# Patient Record
Sex: Female | Born: 1970 | Race: White | Hispanic: No | Marital: Married | Smoking: Never smoker
Health system: Southern US, Community
[De-identification: ages and names within clinical notes are randomized; demographics above are authoritative.]

## PROBLEM LIST (undated history)

## (undated) DIAGNOSIS — Z8619 Personal history of other infectious and parasitic diseases: Secondary | ICD-10-CM

## (undated) DIAGNOSIS — I341 Nonrheumatic mitral (valve) prolapse: Secondary | ICD-10-CM

## (undated) DIAGNOSIS — C4491 Basal cell carcinoma of skin, unspecified: Secondary | ICD-10-CM

## (undated) DIAGNOSIS — Z87440 Personal history of urinary (tract) infections: Secondary | ICD-10-CM

## (undated) DIAGNOSIS — T7840XA Allergy, unspecified, initial encounter: Secondary | ICD-10-CM

## (undated) HISTORY — DX: Basal cell carcinoma of skin, unspecified: C44.91

## (undated) HISTORY — DX: Nonrheumatic mitral (valve) prolapse: I34.1

## (undated) HISTORY — DX: Personal history of other infectious and parasitic diseases: Z86.19

## (undated) HISTORY — DX: Allergy, unspecified, initial encounter: T78.40XA

## (undated) HISTORY — DX: Personal history of urinary (tract) infections: Z87.440

---

## 1993-11-22 HISTORY — PX: BREAST SURGERY: SHX581

## 1999-07-22 ENCOUNTER — Other Ambulatory Visit: Admission: RE | Admit: 1999-07-22 | Discharge: 1999-07-22 | Payer: Self-pay | Admitting: Obstetrics and Gynecology

## 2000-08-01 ENCOUNTER — Other Ambulatory Visit: Admission: RE | Admit: 2000-08-01 | Discharge: 2000-08-01 | Payer: Self-pay | Admitting: Gynecology

## 2001-05-09 ENCOUNTER — Other Ambulatory Visit: Admission: RE | Admit: 2001-05-09 | Discharge: 2001-05-09 | Payer: Self-pay | Admitting: *Deleted

## 2002-06-11 ENCOUNTER — Other Ambulatory Visit: Admission: RE | Admit: 2002-06-11 | Discharge: 2002-06-11 | Payer: Self-pay | Admitting: Obstetrics and Gynecology

## 2003-05-21 ENCOUNTER — Other Ambulatory Visit: Admission: RE | Admit: 2003-05-21 | Discharge: 2003-05-21 | Payer: Self-pay | Admitting: *Deleted

## 2004-01-30 ENCOUNTER — Other Ambulatory Visit: Admission: RE | Admit: 2004-01-30 | Discharge: 2004-01-30 | Payer: Self-pay | Admitting: Obstetrics and Gynecology

## 2004-08-27 ENCOUNTER — Inpatient Hospital Stay (HOSPITAL_COMMUNITY): Admission: AD | Admit: 2004-08-27 | Discharge: 2004-08-29 | Payer: Self-pay | Admitting: Obstetrics and Gynecology

## 2004-10-01 ENCOUNTER — Encounter: Admission: RE | Admit: 2004-10-01 | Discharge: 2004-10-31 | Payer: Self-pay | Admitting: Obstetrics and Gynecology

## 2005-09-07 ENCOUNTER — Ambulatory Visit: Payer: Self-pay | Admitting: Family Medicine

## 2006-05-22 ENCOUNTER — Inpatient Hospital Stay (HOSPITAL_COMMUNITY): Admission: AD | Admit: 2006-05-22 | Discharge: 2006-05-22 | Payer: Self-pay | Admitting: Obstetrics and Gynecology

## 2006-07-20 ENCOUNTER — Inpatient Hospital Stay (HOSPITAL_COMMUNITY): Admission: RE | Admit: 2006-07-20 | Discharge: 2006-07-22 | Payer: Self-pay | Admitting: Obstetrics and Gynecology

## 2008-11-22 HISTORY — PX: BREAST ENHANCEMENT SURGERY: SHX7

## 2009-04-28 ENCOUNTER — Ambulatory Visit: Payer: Self-pay | Admitting: Internal Medicine

## 2009-04-28 LAB — CONVERTED CEMR LAB
Bilirubin Urine: NEGATIVE
Glucose, Urine, Semiquant: NEGATIVE
Ketones, urine, test strip: NEGATIVE
Protein, U semiquant: NEGATIVE
pH: 5

## 2009-05-01 ENCOUNTER — Telehealth (INDEPENDENT_AMBULATORY_CARE_PROVIDER_SITE_OTHER): Payer: Self-pay | Admitting: *Deleted

## 2009-09-26 ENCOUNTER — Ambulatory Visit: Payer: Self-pay | Admitting: Internal Medicine

## 2009-09-26 DIAGNOSIS — Z85828 Personal history of other malignant neoplasm of skin: Secondary | ICD-10-CM | POA: Insufficient documentation

## 2009-09-26 DIAGNOSIS — I7300 Raynaud's syndrome without gangrene: Secondary | ICD-10-CM | POA: Insufficient documentation

## 2009-09-29 ENCOUNTER — Encounter (INDEPENDENT_AMBULATORY_CARE_PROVIDER_SITE_OTHER): Payer: Self-pay | Admitting: *Deleted

## 2010-03-11 ENCOUNTER — Ambulatory Visit: Payer: Self-pay | Admitting: Internal Medicine

## 2010-03-11 DIAGNOSIS — M545 Low back pain, unspecified: Secondary | ICD-10-CM | POA: Insufficient documentation

## 2010-03-11 DIAGNOSIS — IMO0002 Reserved for concepts with insufficient information to code with codable children: Secondary | ICD-10-CM | POA: Insufficient documentation

## 2010-03-11 DIAGNOSIS — R509 Fever, unspecified: Secondary | ICD-10-CM | POA: Insufficient documentation

## 2010-03-11 LAB — CONVERTED CEMR LAB
Bilirubin Urine: NEGATIVE
Blood in Urine, dipstick: NEGATIVE
Inflenza A Ag: NEGATIVE
Influenza B Ag: NEGATIVE
Ketones, urine, test strip: NEGATIVE
Nitrite: NEGATIVE
Specific Gravity, Urine: 1.015

## 2010-03-16 ENCOUNTER — Telehealth (INDEPENDENT_AMBULATORY_CARE_PROVIDER_SITE_OTHER): Payer: Self-pay | Admitting: *Deleted

## 2010-03-16 LAB — CONVERTED CEMR LAB
AST: 22 units/L (ref 0–37)
Albumin: 4.5 g/dL (ref 3.5–5.2)
Alkaline Phosphatase: 53 units/L (ref 39–117)
Basophils Relative: 0.9 % (ref 0.0–3.0)
Bilirubin, Direct: 0 mg/dL (ref 0.0–0.3)
CO2: 29 meq/L (ref 19–32)
Calcium: 9.5 mg/dL (ref 8.4–10.5)
Creatinine, Ser: 0.7 mg/dL (ref 0.4–1.2)
Eosinophils Absolute: 0 10*3/uL (ref 0.0–0.7)
Eosinophils Relative: 1 % (ref 0.0–5.0)
GFR calc non Af Amer: 100.3 mL/min (ref >60)
Hemoglobin: 12.8 g/dL (ref 12.0–15.0)
Lymphocytes Relative: 37.5 % (ref 12.0–46.0)
Monocytes Relative: 6.4 % (ref 3.0–12.0)
Neutro Abs: 2 10*3/uL (ref 1.4–7.7)
Neutrophils Relative %: 54.2 % (ref 43.0–77.0)
RBC: 4.47 M/uL (ref 3.87–5.11)
Sodium: 139 meq/L (ref 135–145)
Total Protein: 7.3 g/dL (ref 6.0–8.3)
WBC: 3.8 10*3/uL — ABNORMAL LOW (ref 4.5–10.5)

## 2010-03-17 ENCOUNTER — Ambulatory Visit: Payer: Self-pay | Admitting: Internal Medicine

## 2010-03-17 DIAGNOSIS — M2550 Pain in unspecified joint: Secondary | ICD-10-CM | POA: Insufficient documentation

## 2010-03-18 ENCOUNTER — Encounter: Payer: Self-pay | Admitting: Internal Medicine

## 2010-03-18 ENCOUNTER — Telehealth (INDEPENDENT_AMBULATORY_CARE_PROVIDER_SITE_OTHER): Payer: Self-pay | Admitting: *Deleted

## 2010-03-19 ENCOUNTER — Telehealth (INDEPENDENT_AMBULATORY_CARE_PROVIDER_SITE_OTHER): Payer: Self-pay | Admitting: *Deleted

## 2010-03-19 LAB — CONVERTED CEMR LAB
Basophils Relative: 2.2 % (ref 0.0–3.0)
Eosinophils Absolute: 0.1 10*3/uL (ref 0.0–0.7)
Eosinophils Relative: 1.5 % (ref 0.0–5.0)
HCT: 40.2 % (ref 36.0–46.0)
Lymphs Abs: 2.3 10*3/uL (ref 0.7–4.0)
MCHC: 33.8 g/dL (ref 30.0–36.0)
MCV: 85.1 fL (ref 78.0–100.0)
Monocytes Absolute: 0.5 10*3/uL (ref 0.1–1.0)
Platelets: 266 10*3/uL (ref 150.0–400.0)
Rhuematoid fact SerPl-aCnc: 20 intl units/mL (ref 0.0–20.0)
WBC: 3.9 10*3/uL — ABNORMAL LOW (ref 4.5–10.5)

## 2010-03-20 ENCOUNTER — Telehealth (INDEPENDENT_AMBULATORY_CARE_PROVIDER_SITE_OTHER): Payer: Self-pay

## 2010-03-20 ENCOUNTER — Telehealth: Payer: Self-pay | Admitting: Family Medicine

## 2010-03-23 ENCOUNTER — Encounter (INDEPENDENT_AMBULATORY_CARE_PROVIDER_SITE_OTHER): Payer: Self-pay | Admitting: *Deleted

## 2010-04-09 ENCOUNTER — Ambulatory Visit: Payer: Self-pay | Admitting: Internal Medicine

## 2010-04-09 DIAGNOSIS — R21 Rash and other nonspecific skin eruption: Secondary | ICD-10-CM | POA: Insufficient documentation

## 2010-04-09 DIAGNOSIS — D7289 Other specified disorders of white blood cells: Secondary | ICD-10-CM | POA: Insufficient documentation

## 2010-04-10 ENCOUNTER — Ambulatory Visit: Payer: Self-pay | Admitting: Internal Medicine

## 2010-04-13 LAB — CONVERTED CEMR LAB
Basophils Absolute: 0.1 10*3/uL (ref 0.0–0.1)
Eosinophils Absolute: 0.1 10*3/uL (ref 0.0–0.7)
HCT: 39.7 % (ref 36.0–46.0)
Hemoglobin: 13.4 g/dL (ref 12.0–15.0)
Lymphs Abs: 1.6 10*3/uL (ref 0.7–4.0)
MCHC: 33.7 g/dL (ref 30.0–36.0)
Neutro Abs: 6.1 10*3/uL (ref 1.4–7.7)
Platelets: 234 10*3/uL (ref 150.0–400.0)
RDW: 13.5 % (ref 11.5–14.6)

## 2010-05-22 ENCOUNTER — Telehealth (INDEPENDENT_AMBULATORY_CARE_PROVIDER_SITE_OTHER): Payer: Self-pay | Admitting: *Deleted

## 2010-06-05 ENCOUNTER — Encounter: Payer: Self-pay | Admitting: Internal Medicine

## 2010-06-16 ENCOUNTER — Ambulatory Visit: Payer: Self-pay | Admitting: Internal Medicine

## 2010-06-16 DIAGNOSIS — R198 Other specified symptoms and signs involving the digestive system and abdomen: Secondary | ICD-10-CM | POA: Insufficient documentation

## 2010-12-20 LAB — CONVERTED CEMR LAB
AST: 21 units/L (ref 0–37)
Albumin: 4.4 g/dL (ref 3.5–5.2)
Alkaline Phosphatase: 55 units/L (ref 39–117)
BUN: 8 mg/dL (ref 6–23)
Basophils Absolute: 0 10*3/uL (ref 0.0–0.1)
CO2: 29 meq/L (ref 19–32)
Calcium: 9.3 mg/dL (ref 8.4–10.5)
Cholesterol: 189 mg/dL (ref 0–200)
Eosinophils Absolute: 0.1 10*3/uL (ref 0.0–0.7)
GFR calc non Af Amer: 86.2 mL/min (ref >60)
Glucose, Bld: 75 mg/dL (ref 70–99)
HDL: 62.8 mg/dL (ref >39.00)
Lymphocytes Relative: 17.3 % (ref 12.0–46.0)
Lymphs Abs: 1.6 10*3/uL (ref 0.7–4.0)
MCHC: 33.7 g/dL (ref 30.0–36.0)
Monocytes Relative: 4.7 % (ref 3.0–12.0)
Platelets: 264 10*3/uL (ref 150.0–400.0)
RDW: 12.1 % (ref 11.5–14.6)
TSH: 1.3 microintl units/mL (ref 0.35–5.50)
Total Bilirubin: 0.9 mg/dL (ref 0.3–1.2)
Triglycerides: 58 mg/dL (ref 0.0–149.0)
VLDL: 11.6 mg/dL (ref 0.0–40.0)

## 2010-12-22 NOTE — Progress Notes (Signed)
Summary: Lab results  Phone Note Outgoing Call Call back at Home Phone [phone]   Call placed by: Shonna Chock,  March 19, 2010 10:42 AM Call placed to: Patient Summary of Call: Spoke with patient:  Low white count & increased lymphocyte count suggest recent viral (non bacterial) process. Sed rate measures inflammation; it is normal, as is the Rheumatoid Arthritis screening test.Consider the supplement Echinacea(OTC)  & 2000 mg vitamin C daily  for 7-10 days to enhance your immune system. Hopp  Patient ok'd all information and is aware copy to be mailed./[facility]  March 19, 2010 10:42 AM

## 2010-12-22 NOTE — Progress Notes (Signed)
Summary: reaction to med ?  Phone Note Call from Patient   Caller: Patient Summary of Call: patient is here for labs -non fasting -patient was seen [id number] - she took 1st dose of citalopram last night - she is dizzy - pupisl dialated - hands shaky - anxiety --- please advise Initial call taken by: Okey Regal [place],  March 18, 2010 11:27 AM  Follow-up for Phone Call        per dr hopper decrease to 1/2 pill daily and add lorazepam 0.5mg  1 tab every 6 to 8 hours as needed #30.Marland KitchenMarland KitchenFelecia Deloach CMA  March 18, 2010 12:14 PM   pt aware rx sent to pharmacy...............Marland KitchenFelecia Deloach CMA  March 18, 2010 12:16 PM     New/Updated Medications: LORAZEPAM 0.5 MG TABS (LORAZEPAM) Take 1 tab every 6 to 8 hours as needed Prescriptions: LORAZEPAM 0.5 MG TABS (LORAZEPAM) Take 1 tab every 6 to 8 hours as needed  #30 x 0   Entered by:   Jeremy Johann CMA   Authorized by:   Marga Melnick MD   Signed by:   Jeremy Johann CMA on 03/18/2010   Method used:   Printed then faxed to ...       [facility] # [address] (retail)       [address]       [place], [place]  [address]       Ph: [phone]       Fax: [phone]   RxID:   [id number]

## 2010-12-22 NOTE — Progress Notes (Signed)
Summary: lab results  Phone Note Call from Patient   Caller: Patient Summary of Call: pt called wanted result of labs. pt also states that symptoms have not subsided and actually have progress some. advise pt per dr hopp refer to neurology, pt ok awaiting appt info. pt also advise to give [place] a call if she needs anything prior to appt with neurology, pt ok..............Marland KitchenFelecia Deloach CMA  March 16, 2010 4:31 PM

## 2010-12-22 NOTE — Progress Notes (Signed)
Summary: SE to meds   Phone Note Outgoing Call   Call placed by: Army Fossa CMA,  March 20, 2010 3:54 PM Summary of Call: I spoke with pt about her lab results, she states that her fever is gone. But she is now having possible reactions to her Citalopram she thinks. She is not sleeping well. She is having numbness and tingling in her arms and legs. She has noticied some Tremors. Advisied pt to stop taking medication, any further advice for pt. Army Fossa CMA  March 20, 2010 3:55 PM   Follow-up for Phone Call        if pt's sxs are worse or different than when she was here on 4/20 probably needs to be evaluated. Follow-up by: Neena Rhymes MD,  March 20, 2010 4:10 PM  Additional Follow-up for Phone Call Additional follow up Details #1::        I spoke with pt and pt's husband and they both say that her symptoms are different. I informed pt that she should be evalutated. Pts husband states to me that she has had worse days than today. He is requesting sat clinic. Scheduled pt also told pts husband if symptoms get worse to please take her to ER. pts husband agreed. Army Fossa CMA  March 20, 2010 4:30 PM

## 2010-12-22 NOTE — Progress Notes (Signed)
Summary: FYI  Phone Note Call from Patient Call back at Home Phone [phone]   Caller: Patient Reason for Call: Talk to Doctor Summary of Call: Patient just called to let us know that she saw her GYN this past month for an infection. He put her on an abx and she said it has made her feel so much better. She said most of the symptoms she had been seeing Dr. Alwyn Ren for recently have supsided. She just wanted to let us know.  Initial call taken by: Harold Barban,  May 22, 2010 1:52 PM

## 2010-12-22 NOTE — Progress Notes (Signed)
Summary: phone  Phone Note Outgoing Call   Call placed by: Raechel Ache, RN,  March 20, 2010 4:47 PM Call placed to: Patient Summary of Call: advised Sat clinic not appropriate for her problems- acute only and to f/u with Dr Alwyn Ren next week per Dr Clent Ridges. Initial call taken by: Raechel Ache, RN,  March 20, 2010 4:48 PM

## 2010-12-22 NOTE — Assessment & Plan Note (Signed)
Summary: symptoms are progressing//lch   Vital Signs:  Patient profile:   40 year old female Weight:      123 pounds Temp:     98.8 degrees F oral Pulse rate:   78 / minute Resp:     15 per minute BP sitting:   118 / 78  (left arm)  Vitals Entered By: Jeremy Johann CMA (March 17, 2010 2:40 PM) CC: increasing burning, cool sensations on several parts of the body, Lower Extremity Joint pain Comments REVIEWED MED LIST, PATIENT AGREED DOSE AND INSTRUCTION CORRECT    CC:  increasing burning, cool sensations on several parts of the body, and Lower Extremity Joint pain.  History of Present Illness: She has had migratory joint pains as of 03/13/2010 involving hands, wrists , shoulders , hips, knees & feet intermittently. The patient reports redness, popping, stiffness for >1 hr, and weakness, but denies swelling, locking, and decreased ROM.  The pain is described as burning-aching.  The patient denies the following symptoms: eye symptoms. The back pain has resolved.Terrible dreams about not being able to breathe;early  awakening. No constellation of headaches, chest pain, flushing & diarrhea. I have been overwhelmed since motherhood. Her mother-in-law has MS; that is a concern for her.  Allergies: 1)  ! Cipro 2)  ! Hydrocodone  Review of Systems General:  Complains of fever and sweats; denies chills; Temp 99 or <. Eyes:  Denies discharge, red eye, and vision loss-both eyes. Resp:  Denies cough and sputum productive. GI:  Denies constipation and diarrhea. GU:  Denies discharge, dysuria, and hematuria. Derm:  Complains of changes in color of skin and rash; denies changes in nail beds, dryness, hair loss, insect bite(s), and lesion(s); Minor splotchy rash over abdomen. No ticks isolated. Neuro:  Denies numbness; Intermittent tingling in hands & feet. Psych:  Complains of anxiety and easily tearful; denies depression, easily angered, irritability, and panic attacks. Heme:  Denies abnormal  bruising and bleeding.  Physical Exam  General:  Thin but well-nourished,in no acute distress; alert,appropriate and cooperative throughout examination Eyes:  No corneal or conjunctival inflammation noted.  Perrla.No lid lag  Neck:  No deformities, masses, or tenderness noted.Neck supple Lungs:  Normal respiratory effort, chest expands symmetrically. Lungs are clear to auscultation, no crackles or wheezes. Heart:  Normal rate and regular rhythm. S1 and S2 normal without gallop, murmur, click, rub. S4 Abdomen:  Bowel sounds positive,abdomen soft and non-tender without masses, organomegaly or hernias noted. Extremities:  No clubbing, cyanosis, edema, or deformity noted with normal full range of motion of all joints.   Neurologic:  alert & oriented X3, strength normal in all extremities, and DTRs symmetrical and normal.   Skin:  Intact without suspicious lesions or rashes Cervical Nodes:  No lymphadenopathy noted Axillary Nodes:  No palpable lymphadenopathy Psych:  tearful when discussing her fears  and slightly anxious.     Impression & Recommendations:  Problem # 1:  ARTHRALGIA (ICD-719.40)  Orders: Venipuncture ([address]) T-Lyme Disease (60454-09811) T- * Misc. Laboratory test [phone])  Problem # 2:  FEVER UNSPECIFIED (ICD-780.60)  Orders: Venipuncture (29562) TLB-CBC Platelet - w/Differential (85025-CBCD) TLB-Rheumatoid Factor (RA) (13086-VH) TLB-Sedimentation Rate (ESR) (85652-ESR) T-Lyme Disease (84696-29528) T- * Misc. Laboratory test [phone])  Complete Medication List: 1)  Ibuprofen 200 Mg Tabs (Ibuprofen) .... As needed 2)  Doxycycline Hyclate 100 Mg Caps (Doxycycline hyclate) .Marland Kitchen.. 1 two times a day ; avoid sun 3)  Citalopram Hydrobromide 20 Mg Tabs (Citalopram hydrobromide) .Marland Kitchen.. 1 once daily  Patient  Instructions: 1)  Please keep Temp Diary. Prescriptions: CITALOPRAM HYDROBROMIDE 20 MG TABS (CITALOPRAM HYDROBROMIDE) 1 once daily  #30 x 5   Entered and Authorized by:    Marga Melnick MD   Signed by:   Marga Melnick MD on 03/17/2010   Method used:   Print then Give to Patient   RxID:   [id number] DOXYCYCLINE HYCLATE 100 MG CAPS (DOXYCYCLINE HYCLATE) 1 two times a day ; avoid sun  #20 x 0   Entered and Authorized by:   Marga Melnick MD   Signed by:   Marga Melnick MD on 03/17/2010   Method used:   Print then Give to Patient   RxID:   [id number]

## 2010-12-22 NOTE — Assessment & Plan Note (Signed)
Summary: backpain/kdc   Vital Signs:  Patient profile:   40 year old female Weight:      125.4 pounds Temp:     99.3 degrees F oral Pulse rate:   84 / minute BP sitting:   94 / 68  (left arm) Cuff size:   regular  Vitals Entered By: Shonna Chock (March 11, 2010 12:27 PM) CC: Onset Friday: Lower back pain, low grade fever, buring sensations on several parts of the body, Back pain Comments REVIEWED MED LIST, PATIENT AGREED DOSE AND INSTRUCTION CORRECT    CC:  Onset Friday: Lower back pain, low grade fever, buring sensations on several parts of the body, and Back pain.  History of Present Illness:  Back Pain      This is a 40 year old woman who presents with Back pain .  The patient reports fever&  chills, slight leg weakness, rest pain, and inability to do housework, but denies loss of sensation, fecal incontinence, urinary incontinence, urinary retention, and dysuria.  The pain is located in the mid low back.  The pain began at home  gradually on Sat 03/07/2010.  The pain radiates to the right leg below the knee & into foot as an "icy hot " sensation. Same sensation in both legs 04/16.Also same sensation intermittently  interscapular areas,posterior upper arms .  The pain is made worse by flexion and inactivity( sitting).  The pain is made better by activity(ambulation) and minimally with NSAID medications.  On 04/10 she had "sensation that shoes too tight & calves tingling " for 1-2 hrs  after sitting in church. Now all symptoms are improving.PMH of "pop"  with   epidural administration   with  residual "coldness "  during  second delivery.  Allergies: 1)  ! Cipro 2)  ! Hydrocodone  Past History:  Past Medical History: MVP  based on clinical impression (no ECHO done) ; IUD Skin cancer, PMH of, Basal Cell 07/2009, Dr Karlyn Agee  Past Surgical History: G2 P2; breast augmentation 3/10; Wisdom teeth extration; Nasal cautery in high school  for epistaxis  Review of Systems CV:  Denies  chest pain or discomfort and palpitations. GI:  Denies diarrhea. GU:  Denies discharge and hematuria. MS:  Denies joint pain, joint redness, and joint swelling. Derm:  Denies changes in color of skin, lesion(s), and rash. Neuro:  Denies brief paralysis, disturbances in coordination, headaches, inability to speak, poor balance, and tremors. Heme:  Denies abnormal bruising and bleeding.  Physical Exam  General:  Thin but  well-nourished,in no acute distress; alert,appropriate and cooperative throughout examination Eyes:  No corneal or conjunctival inflammation noted. EOMI. Perrla. Field of  Vision grossly normal. Mouth:  Oral mucosa and oropharynx without lesions or exudates.  Teeth in good repair.No tongue deviation Neck:  No deformities, masses, or tenderness noted.Supple Lungs:  Normal respiratory effort, chest expands symmetrically. Lungs are clear to auscultation, no crackles or wheezes. Heart:  Normal rate and regular rhythm. S1 and S2 normal without gallop, murmur,  rub .Click @ apex  Abdomen:  Bowel sounds positive,abdomen soft and non-tender without masses, organomegaly or hernias noted. Pulses:  R and L carotid,radial,dorsalis pedis and posterior tibial pulses are full and equal bilaterally. No carotid bruits Extremities:  No clubbing, cyanosis, edema, or deformity noted with normal full range of motion of all joints.  Negative SLR bilaterally Neurologic:  alert & oriented X3, cranial nerves II-XII intact, strength normal in all extremities, sensation intact to light touch, heel/ toe  gait normal, DTRs symmetrical and normal, finger-to-nose normal, heel-to-shin normal, toes down bilaterally on Babinski, and Romberg negative.  No pronation drift.RAM & rapid speech normal Skin:  Intact without suspicious lesions or rashes Cervical Nodes:  No lymphadenopathy noted Axillary Nodes:  No palpable lymphadenopathy Psych:  memory intact for recent and remote, normally interactive, and good eye  contact.     Impression & Recommendations:  Problem # 1:  LOW BACK PAIN, ACUTE (ICD-724.2)  Her updated medication list for this problem includes:    Ibuprofen 200 Mg Tabs (Ibuprofen) .Marland Kitchen... As needed  Orders: Venipuncture (09811) TLB-Sedimentation Rate (ESR) (85652-ESR)  Problem # 2:  UNSPECIFIED NEURALGIA NEURITIS AND RADICULITIS (ICD-729.2)  in both legs & arms & interscapular area  Orders: Venipuncture (91478) TLB-Sedimentation Rate (ESR) (85652-ESR)  Problem # 3:  FEVER UNSPECIFIED (ICD-780.60)  Orders: Flu A+B (29562) Venipuncture (13086) TLB-BMP (Basic Metabolic Panel-BMET) (80048-METABOL) TLB-CBC Platelet - w/Differential (85025-CBCD) TLB-Hepatic/Liver Function Pnl (80076-HEPATIC) TLB-TSH (Thyroid Stimulating Hormone) (84443-TSH)  Complete Medication List: 1)  Ibuprofen 200 Mg Tabs (Ibuprofen) .... As needed  Other Orders: UA Dipstick w/o Micro (manual) (57846)  Patient Instructions: 1)  Keep  Symptom & Temp Diary. Neurology consult if symptoms persist or progress  Laboratory Results   Urine Tests    Routine Urinalysis   Color: yellow Appearance: Clear Glucose: negative   (Normal Range: Negative) Bilirubin: negative   (Normal Range: Negative) Ketone: negative   (Normal Range: Negative) Spec. Gravity: 1.015   (Normal Range: 1.003-1.035) Blood: negative   (Normal Range: Negative) pH: 6.0   (Normal Range: 5.0-8.0) Protein: negative   (Normal Range: Negative) Urobilinogen: 0.2   (Normal Range: 0-1) Nitrite: negative   (Normal Range: Negative) Leukocyte Esterace: negative   (Normal Range: Negative)     Date/Time Reported: March 11, 2010 1:16 PM  Other Tests  Influenza A: negative Influenza B: negative

## 2010-12-22 NOTE — Letter (Signed)
Summary: Primary Care Consult Scheduled Letter  [facility]  [address] [place], [place] [address]   Phone: [phone]  Fax: [phone]      03/23/2010 MRN: [id number]  [facility] [address] [place], [place]  [address]    Dear Alexa Gonzalez,    We have scheduled an appointment for you.  At the recommendation of Dr. Marga Melnick, we have scheduled you a consult with Dr. Avie Echevaria of [facility] on 04-30-2010 at 9:30am.  Their address is [address], [address], [place] [address]. The office phone number is [phone].  If this appointment day and time is not convenient for you, please feel free to call the office of the doctor you are being referred to at the number listed above and reschedule the appointment.    It is important for you to keep your scheduled appointments. We are here to make sure you are given good patient care.   Thank you,    Renee, Patient Care Coordinator [facility]

## 2010-12-22 NOTE — Assessment & Plan Note (Signed)
Summary: talk about symptoms//lch   Vital Signs:  Patient profile:   40 year old female Weight:      122.0 pounds Temp:     99.1 degrees F oral Pulse rate:   64 / minute Resp:     15 per minute BP sitting:   117 / 80  (left arm) Cuff size:   regular  Vitals Entered By: Shonna Chock (Apr 09, 2010 3:37 PM) CC: Discuss Crazy Symptoms x 5 weeks Comments REVIEWED MED LIST, PATIENT AGREED DOSE AND INSTRUCTION CORRECT    CC:  Discuss Crazy Symptoms x 5 weeks.  History of Present Illness: Paresthesias , burning & electric sensation have  no pattern & involves all anatomic area including limbs, face , thorax & pubic area. Low grad fevers especially in afternoon. Slight tremor L ulnar distribution & light rash over abdomen in am & @ night . Arthralgias diffusely, L > R joints. Insomnia & excess daytime somnulence. Dr Sandria Manly diagnosed possible viral process & recommended monitoring. Lab copies reviewed. She completed Doxycycline over 14 days. Even 1/2 Citalopram 20 mg was not tolerated; it caused insomnia. Lorazepam was not tolerated;it "wired me"  Allergies: 1)  ! Cipro 2)  ! Hydrocodone  Physical Exam  General:  Thin,in no acute distress; alert,appropriate and cooperative throughout examination Eyes:  No corneal or conjunctival inflammation noted. No lid lag. Perrla. Neck:  No deformities, masses, or tenderness noted. Thyroid full Lungs:  Normal respiratory effort, chest expands symmetrically. Lungs are clear to auscultation, no crackles or wheezes. Heart:  Normal rate and regular rhythm. S1 and S2 normal without gallop, murmur,  rub. Click suggested @ apex Abdomen:  Bowel sounds positive,abdomen soft and non-tender without masses, organomegaly or hernias noted. Neurologic:  alert & oriented X3 and DTRs symmetrical and normal.  No tremor Skin:  Faint erythema  over abdomen Cervical Nodes:  No lymphadenopathy noted Axillary Nodes:  No palpable lymphadenopathy Psych:  memory intact for  recent and remote, normally interactive, and slightly anxious & apprppriately frustrated..     Impression & Recommendations:  Problem # 1:  FEVER UNSPECIFIED (ICD-780.60)  Problem # 2:  ARTHRALGIA (ICD-719.40)  Problem # 3:  UNSPECIFIED NEURALGIA NEURITIS AND RADICULITIS (ICD-729.2)  Problem # 4:  LYMPHOCYTOSIS (ICD-288.8)  Problem # 5:  RASH-NONVESICULAR (ICD-782.1)  Complete Medication List: 1)  Vitamin C 1000 Mg Tabs (Ascorbic acid) .Marland Kitchen.. 1 by mouth once daily 2)  Zolpidem Tartrate 5 Mg Tabs (Zolpidem tartrate) .Marland Kitchen.. 1 at bedtime every 3rd night as needed 3)  Cymbalta 60 Mg Cpep (Duloxetine hcl) .Marland Kitchen.. 1 once daily  Patient Instructions: 1)  Schedule Lans: Lyme & RMSF  Titers, CBC &dif. Keep  Temp Diary. assess response to low dose Cymbalta 30 mg once daily . Prescriptions: CYMBALTA 60 MG CPEP (DULOXETINE HCL) 1 once daily  #30 x 1   Entered and Authorized by:   Marga Melnick MD   Signed by:   Marga Melnick MD on 04/09/2010   Method used:   Print then Give to Patient   RxID:   [id number] ZOLPIDEM TARTRATE 5 MG TABS (ZOLPIDEM TARTRATE) 1 at bedtime every 3rd night as needed  #10 x 0   Entered and Authorized by:   Marga Melnick MD   Signed by:   Marga Melnick MD on 04/09/2010   Method used:   Print then Give to Patient   RxID:   [id number]   Appended Document: talk about symptoms//lch MDQ negative for Bipolar Disorder

## 2010-12-22 NOTE — Assessment & Plan Note (Signed)
Summary: review lab work//lch   Vital Signs:  Patient profile:   40 year old female Weight:      124 pounds Pulse rate:   64 / minute Resp:     12 per minute BP sitting:   100 / 62  (left arm) Cuff size:   regular  Vitals Entered By: Shonna Chock CMA (June 16, 2010 9:38 AM) CC: Follow-up on labs done by [facility]   CC:  Follow-up on labs done by [facility].  History of Present Illness: Labs from Gyn reviewed ; titers for EBV & CMV were elevated . Her arthralgias & neurologic symptoms have resolved  completely  post  treatment with Diflucan & 7 day course of Metronidazole.  Current Medications (verified): 1)  Vitamin C 1000 Mg Tabs (Ascorbic Acid) .Marland Kitchen.. 1 By Mouth Once Daily 2)  Multivitamins  Tabs (Multiple Vitamin) .Marland Kitchen.. 1 By Mouth Once Daily 3)  Ibuprofen 200 Mg Tabs (Ibuprofen) .Marland Kitchen.. 1 By Mouth As Needed  Allergies: 1)  ! Cipro 2)  ! Hydrocodone  Review of Systems General:  Denies chills, fever, sweats, and weight loss. ENT:  Complains of hoarseness. Resp:  Denies cough and sputum productive. GI:  Denies diarrhea; Some loose stools. GU:  Denies discharge, dysuria, and hematuria. MS:  Denies joint pain, joint redness, and joint swelling. Derm:  Denies lesion(s) and rash.  Physical Exam  General:  Thin but well-nourished,in no acute distress; alert,appropriate and cooperative throughout examination Eyes:  No corneal or conjunctival inflammation noted. Perrla. No icterus  Mouth:  Oral mucosa and oropharynx without lesions or exudates.  Teeth in good repair. Abdomen:  Bowel sounds positive,abdomen soft and non-tender without masses, organomegaly or hernias noted. Skin:  Intact without suspicious lesions or rashes Cervical Nodes:  No lymphadenopathy noted Axillary Nodes:  No palpable lymphadenopathy Psych:  memory intact for recent and remote, normally interactive, good eye contact, and not anxious appearing.     Impression & Recommendations:  Problem # 1:   LYMPHOCYTOSIS (ICD-288.8) Resolved, probably from EBV OR CMV  Problem # 2:  CHANGE IN BOWELS (GNF-621.30)  Problem # 3:  ARTHRALGIA (ICD-719.40) Resolved  Complete Medication List: 1)  Vitamin C 1000 Mg Tabs (Ascorbic acid) .Marland Kitchen.. 1 by mouth once daily 2)  Multivitamins Tabs (Multiple vitamin) .Marland Kitchen.. 1 by mouth once daily 3)  Ibuprofen 200 Mg Tabs (Ibuprofen) .Marland Kitchen.. 1 by mouth as needed  Patient Instructions: 1)  Echinacea & vitamin C as needed as discussed. Neti pot once daily until sinuses are clear as needed . Align once daily until bowels are normal.Take @ least 6 weeks to to reach 30-45 min of CVE 3-4X /week.

## 2011-04-09 NOTE — H&P (Signed)
Alexa Gonzalez, Alexa Gonzalez            ACCOUNT NO.:  [id number]   MEDICAL RECORD NO.:  [id number]          PATIENT TYPE:  INP   LOCATION:  9174                          FACILITY:  [facility]   PHYSICIAN:  Lenoard Aden, M.D.DATE OF BIRTH:  Jul 29, 1971   DATE OF ADMISSION:  08/27/2004  DATE OF DISCHARGE:                                HISTORY & PHYSICAL   CHIEF COMPLAINT:  Labor.   HISTORY OF PRESENT ILLNESS:  A 40 year old white female, G1, P0, EDD August 26, 2004 at 40 1/7 weeks with regular contractions. She has allergies to  CIPRO.   MEDICATIONS:  Prenatal vitamins.   PAST MEDICAL HISTORY:  Remarkable for a history of mitral valve prolapse  which requires antibiotic prophylaxis with no cardiology workup. History of  UTI.  History of benign breast growth removal. Family history of breast  cancer and myocardial infarction.   PRENATAL LAB DATA:  Blood type B positive, Rh antibody negative, rubella  immune, hepatitis and HIV negative. GBS positive.   PHYSICAL EXAMINATION:  GENERAL:  She is a well-developed, well-nourished,  white female in no acute distress.  HEENT:  Normal.  LUNGS:  Clear.  HEART:  Regular rhythm.  ABDOMEN:  Soft, gravid, nontender, estimated fetal weight 7.5 pounds. The  cervix is 4-5 cm, 100% vertex and 0.  EXTREMITIES:  No cords.  NEUROLOGIC:  Nonfocal.   IMPRESSION:  Term intrauterine pregnancy in active labor.   PLAN:  Epidural, anticipate attempts at vaginal delivery and __________ with  ampicillin and gentamycin.      RJT/MEDQ  D:  08/27/2004  T:  08/27/2004  Job:  [id number]

## 2011-04-09 NOTE — H&P (Signed)
NAMEIRENA, Alexa Gonzalez            ACCOUNT NO.:  [id number]   MEDICAL RECORD NO.:  [id number]          PATIENT TYPE:  INP   LOCATION:  9103                          FACILITY:  [facility]   PHYSICIAN:  Lenoard Aden, M.D.DATE OF BIRTH:  [birth year]   DATE OF ADMISSION:  07/20/2006  DATE OF DISCHARGE:                                HISTORY & PHYSICAL   CHIEF COMPLAINT:  Post dates.   She is a 40 year old white female, G2, P1 with an EDC of July 15, 2006 at  72-weeks' gestation for induction. She has a history of spontaneous vaginal  delivery x1.   ALLERGIES:  CIPRO.   MEDICATIONS:  Prenatal vitamins.   She has a history of chronic UTI and mitral valve prolapse, history of  positive GBS with the last delivery. She is a nonsmoker, nondrinker. She  denies domestic or physical violence. Prenatal course otherwise  uncomplicated.   PHYSICAL EXAMINATION:  GENERAL:  She is a well-developed, well-nourished,  white female in no acute distress.  HEENT:  Normal.  LUNGS:  Clear.  HEART:  Regular rhythm.  ABDOMEN:  Soft, gravid, nontender.  PELVIC:  Cervix is 3 to 4, 80%, vertex, -1.  EXTREMITIES:  ____________.  NEUROLOGICAL:  Nonfocal.   IMPRESSION:  Post dates intrauterine pregnancy for induction.   PLAN:  Anticipate attempt at vaginal delivery.      Lenoard Aden, M.D.  Electronically Signed     RJT/MEDQ  D:  07/20/2006  T:  07/20/2006  Job:  [id number]

## 2011-11-23 DIAGNOSIS — C4491 Basal cell carcinoma of skin, unspecified: Secondary | ICD-10-CM

## 2011-11-23 HISTORY — DX: Basal cell carcinoma of skin, unspecified: C44.91

## 2014-01-29 ENCOUNTER — Other Ambulatory Visit: Payer: Self-pay

## 2014-01-29 DIAGNOSIS — Z1231 Encounter for screening mammogram for malignant neoplasm of breast: Secondary | ICD-10-CM

## 2014-02-19 ENCOUNTER — Ambulatory Visit: Payer: Self-pay

## 2014-03-01 ENCOUNTER — Ambulatory Visit: Payer: Self-pay

## 2014-03-05 ENCOUNTER — Ambulatory Visit
Admission: RE | Admit: 2014-03-05 | Discharge: 2014-03-05 | Disposition: A | Discharge status: Home / Self Care | Payer: BC Managed Care – PPO | Source: Ambulatory Visit

## 2014-03-05 DIAGNOSIS — Z1231 Encounter for screening mammogram for malignant neoplasm of breast: Secondary | ICD-10-CM

## 2014-03-22 HISTORY — PX: REFRACTIVE SURGERY: SHX103

## 2014-03-22 LAB — HM PAP SMEAR

## 2014-04-04 ENCOUNTER — Encounter (HOSPITAL_COMMUNITY): Payer: Self-pay | Admitting: Emergency Medicine

## 2014-04-04 ENCOUNTER — Emergency Department (INDEPENDENT_AMBULATORY_CARE_PROVIDER_SITE_OTHER)
Admission: EM | Admit: 2014-04-04 | Discharge: 2014-04-04 | Disposition: A | Discharge status: Home / Self Care | Payer: BC Managed Care – PPO | Source: Home / Self Care | Attending: Family Medicine | Admitting: Family Medicine

## 2014-04-04 ENCOUNTER — Telehealth: Payer: Self-pay | Admitting: Internal Medicine

## 2014-04-04 DIAGNOSIS — R109 Unspecified abdominal pain: Secondary | ICD-10-CM

## 2014-04-04 LAB — POCT URINALYSIS DIP (DEVICE)
Bilirubin Urine: NEGATIVE
GLUCOSE, UA: NEGATIVE mg/dL
Hgb urine dipstick: NEGATIVE
Ketones, ur: NEGATIVE mg/dL
Leukocytes, UA: NEGATIVE
NITRITE: NEGATIVE
Protein, ur: 30 mg/dL — AB
Specific Gravity, Urine: 1.015 (ref 1.005–1.030)
UROBILINOGEN UA: 0.2 mg/dL (ref 0.0–1.0)
pH: 8.5 — ABNORMAL HIGH (ref 5.0–8.0)

## 2014-04-04 LAB — POCT PREGNANCY, URINE: Preg Test, Ur: NEGATIVE

## 2014-04-04 NOTE — Telephone Encounter (Signed)
Patient Information:  Caller Name: Zamirah  Phone: [phone]  Patient: Alexa Gonzalez, Alexa Gonzalez  Gender: Female  DOB: 1970/12/17  Age: 43 Years  PCP: Other  Pregnant: No  Office Follow Up:  Does the office need to follow up with this patient?: No  Instructions For The Office: N/A  RN Note:  Blood in Stool w/ RUQ pain, onset 5-13. Pt has had 3 episodes of blood mixed in stools, moderate amount, w/ RUQ pain, stool is loose consistancy.  Pt denies injuries or severe abdominal pain, states it's more of a constant full ache, worsen w/ last BM.  Pt used to see Dr Linna Darner in [place] office, last OV was 2011, per [facility], where Dr Linna Darner is now, Pt is no longer consider active d/t over 3 years since last seen, Pt has up coming appt in October 2015.  Abdominal pain and Rectal Bleeding protocols used, ED dispo.  Office agreed w/ ED dispo, advised Pt to f/u w/ [place] office if ED MD advises Pt to be seen sooner than Oct.  Pt verbalized understanding.  Symptoms  Reason For Call & Symptoms: Blood in Stool w/ RUQ pain, onset 5-13.  Reviewed Health History In EMR: Yes  Reviewed Medications In EMR: Yes  Reviewed Allergies In EMR: Yes  Reviewed Surgeries / Procedures: Yes  Date of Onset of Symptoms: 04/03/2014 OB / GYN:  LMP: 03/13/2014  Guideline(s) Used:  Abdominal Pain - Female  Rectal Bleeding  Disposition Per Guideline:   Go to ED Now (or to Office with PCP Approval)  Reason For Disposition Reached:   Bloody, black, or tarry bowel movements  Advice Given:  N/A  Patient Will Follow Care Advice:  YES

## 2014-04-04 NOTE — ED Notes (Addendum)
Pt  Reports      Pain   abd       r  sided    Worse  Last  Pm           denys  Any   Vomiting  Perhaps    Some  Nausea           notived   Some  Blood  In  bm  As  Well  -  Pain is  Actually  Better  At this  Time    Also  Reports  Some    Vaginal  Discharge  As  Well  That     She  States  May  Be  Related  To  Periods

## 2014-04-04 NOTE — ED Provider Notes (Signed)
CSN: [id number]     Arrival date & time 04/04/14  1108 History   First MD Initiated Contact with Patient 04/04/14 1209     Chief Complaint  Patient presents with  . Abdominal Pain   (Consider location/radiation/quality/duration/timing/severity/associated sxs/prior Treatment) HPI Comments: Symptoms began yesterday evening. They have waxed and waned and are minimal at time of UCC examination. States she had 2-3 BMs this morning that were dark red, but then she recalled that she drank a beet juice veggie drink yesterday. Mild nausea yesterday without vomiting. No N/V/D/C today. No GU sx. No change in symptoms with meal intake and no anorexia. No fever. No known ill contacts. No previous episodes.  PCP: Dr. Linna Darner   Patient is a 43 y.o. female presenting with abdominal pain. The history is provided by the patient.  Abdominal Pain Pain location:  R flank Pain quality: aching, bloating, cramping and dull   Pain radiates to:  Does not radiate Pain severity:  Mild Onset quality:  Gradual Duration:  1 day Progression:  Waxing and waning Chronicity:  New Context: not previous surgeries, not recent travel, not sick contacts, not suspicious food intake and not trauma   Worsened by:  Nothing tried Ineffective treatments:  None tried Associated symptoms: belching   Associated symptoms: no anorexia, no chest pain, no chills, no constipation, no cough, no diarrhea, no dysuria, no fatigue, no fever, no flatus, no hematemesis, no hematochezia, no hematuria, no melena, no nausea, no shortness of breath, no sore throat, no vaginal bleeding, no vaginal discharge and no vomiting   Risk factors: no alcohol abuse, no aspirin use, not elderly, has not had multiple surgeries, no NSAID use, not obese, not pregnant and no recent hospitalization     History reviewed. No pertinent past medical history. Past Surgical History  Procedure Laterality Date  . Breast surgery     History reviewed. No pertinent family  history. History  Substance Use Topics  . Smoking status: Never Smoker   . Smokeless tobacco: Not on file  . Alcohol Use: Yes   OB History   Grav Para Term Preterm Abortions TAB SAB Ect Mult Living                 Review of Systems  Constitutional: Negative for fever, chills and fatigue.  HENT: Negative.  Negative for sore throat.   Eyes: Negative.   Respiratory: Negative for cough, chest tightness, shortness of breath and wheezing.   Cardiovascular: Negative for chest pain.  Gastrointestinal: Positive for abdominal pain. Negative for nausea, vomiting, diarrhea, constipation, blood in stool, melena, hematochezia, abdominal distention, anal bleeding, anorexia, flatus and hematemesis.  Endocrine: Negative for polydipsia, polyphagia and polyuria.  Genitourinary: Negative for dysuria, urgency, frequency, hematuria, flank pain, decreased urine volume, vaginal bleeding, vaginal discharge, difficulty urinating, genital sores, vaginal pain, menstrual problem and pelvic pain.  Musculoskeletal: Negative for arthralgias, back pain and myalgias.  Skin: Negative.   Allergic/Immunologic: Negative for immunocompromised state.  Neurological: Negative for dizziness, weakness, light-headedness and headaches.    Allergies  Ciprofloxacin and Hydrocodone  Home Medications   Prior to Admission medications   Medication Sig Start Date End Date Taking? Authorizing Provider  Difluprednate (DUREZOL OP) Apply to eye.   Yes Historical Provider, MD  Ofloxacin (FLOXIN PO) Take by mouth.   Yes Historical Provider, MD   BP 116/81  Pulse 62  Temp(Src) 98.3 F (36.8 C) (Oral)  Resp 16  SpO2 100%  LMP 03/22/2014 Physical Exam  Nursing note and vitals  reviewed. Constitutional: She is oriented to person, place, and time. She appears well-developed and well-nourished. No distress.  HENT:  Head: Normocephalic and atraumatic.  Mouth/Throat: Oropharynx is clear and moist.  Eyes: Conjunctivae are normal. No  scleral icterus.  Neck: Normal range of motion. Neck supple.  Cardiovascular: Normal rate, regular rhythm and normal heart sounds.   Pulmonary/Chest: Effort normal and breath sounds normal.  Abdominal: Soft. Normal appearance and bowel sounds are normal. She exhibits no mass. There is no hepatosplenomegaly. There is no tenderness. There is no rigidity, no rebound, no guarding and no CVA tenderness. No hernia. Hernia confirmed negative in the ventral area.    Musculoskeletal: Normal range of motion.  Neurological: She is alert and oriented to person, place, and time.  Skin: Skin is warm and dry. No rash noted. No erythema.  Psychiatric: She has a normal mood and affect. Her behavior is normal.    ED Course  Procedures (including critical care time) Labs Review Labs Reviewed  POCT URINALYSIS DIP (DEVICE) - Abnormal; Notable for the following:    pH 8.5 (*)    Protein, ur 30 (*)    All other components within normal limits  POCT PREGNANCY, URINE    Imaging Review No results found.   MDM   1. Abdominal pain    UPT negative and UA grossly normal. We had a discussion about possible diagnoses based upon her history and exam including early acute appendicitis and/or ovarian cyst rupture/leak. We discussed transfer to [facility] for either pelvic sonography or CT A/P vs. Careful and watchful waiting at home and patient states she would much prefer to be discharged home. She voices clear understanding of red flag reasons for return for re-evaluation. Advised to stick to either a clear liquid diet or bland diet over next 6-8 hours and seek re-evaluation if symptoms intensify.   [place], [place] 04/04/14 1331

## 2014-04-04 NOTE — Discharge Instructions (Signed)
As we discussed, the two most likely concerns are for that of early acute appendicitis or ovarian cyst or cyst rupture. Your urine studies were normal. I would suggest sticking with a bland or clear liquid diet over the next 6-8 hours and if symptoms become more intense or persistent, or are associated with appetite loss, vomiting, fever, bleeding in urine or stool, or other symptoms of concern, please have yourself re-evaluated at your nearest Emergency Room.    Abdominal Pain, Adult Many things can cause abdominal pain. Usually, abdominal pain is not caused by a disease and will improve without treatment. It can often be observed and treated at home. Your health care provider will do a physical exam and possibly order blood tests and X-rays to help determine the seriousness of your pain. However, in many cases, more time must pass before a clear cause of the pain can be found. Before that point, your health care provider may not know if you need more testing or further treatment. HOME CARE INSTRUCTIONS  Monitor your abdominal pain for any changes. The following actions may help to alleviate any discomfort you are experiencing:  Only take over-the-counter or prescription medicines as directed by your health care provider.  Do not take laxatives unless directed to do so by your health care provider.  Try a clear liquid diet (broth, tea, or water) as directed by your health care provider. Slowly move to a bland diet as tolerated. SEEK MEDICAL CARE IF:  You have unexplained abdominal pain.  You have abdominal pain associated with nausea or diarrhea.  You have pain when you urinate or have a bowel movement.  You experience abdominal pain that wakes you in the night.  You have abdominal pain that is worsened or improved by eating food.  You have abdominal pain that is worsened with eating fatty foods. SEEK IMMEDIATE MEDICAL CARE IF:   Your pain does not go away within 2 hours.  You have a  fever.  You keep throwing up (vomiting).  Your pain is felt only in portions of the abdomen, such as the right side or the left lower portion of the abdomen.  You pass bloody or black tarry stools. MAKE SURE YOU:  Understand these instructions.   Will watch your condition.   Will get help right away if you are not doing well or get worse.  Document Released: 08/18/2005 Document Revised: 08/29/2013 Document Reviewed: 07/18/2013 [facility] Patient Information 2014 [place].  Abdominal Pain, Women Abdominal (stomach, pelvic, or belly) pain can be caused by many things. It is important to tell your doctor:  The location of the pain.  Does it come and go or is it present all the time?  Are there things that start the pain (eating certain foods, exercise)?  Are there other symptoms associated with the pain (fever, nausea, vomiting, diarrhea)? All of this is helpful to know when trying to find the cause of the pain. CAUSES   Stomach: virus or bacteria infection, or ulcer.  Intestine: appendicitis (inflamed appendix), regional ileitis (Crohn's disease), ulcerative colitis (inflamed colon), irritable bowel syndrome, diverticulitis (inflamed diverticulum of the colon), or cancer of the stomach or intestine.  Gallbladder disease or stones in the gallbladder.  Kidney disease, kidney stones, or infection.  Pancreas infection or cancer.  Fibromyalgia (pain disorder).  Diseases of the female organs:  Uterus: fibroid (non-cancerous) tumors or infection.  Fallopian tubes: infection or tubal pregnancy.  Ovary: cysts or tumors.  Pelvic adhesions (scar tissue).  Endometriosis (  uterus lining tissue growing in the pelvis and on the pelvic organs).  Pelvic congestion syndrome (female organs filling up with blood just before the menstrual period).  Pain with the menstrual period.  Pain with ovulation (producing an egg).  Pain with an IUD (intrauterine device, birth  control) in the uterus.  Cancer of the female organs.  Functional pain (pain not caused by a disease, may improve without treatment).  Psychological pain.  Depression. DIAGNOSIS  Your doctor will decide the seriousness of your pain by doing an examination.  Blood tests.  X-rays.  Ultrasound.  CT scan (computed tomography, special type of X-ray).  MRI (magnetic resonance imaging).  Cultures, for infection.  Barium enema (dye inserted in the large intestine, to better view it with X-rays).  Colonoscopy (looking in intestine with a lighted tube).  Laparoscopy (minor surgery, looking in abdomen with a lighted tube).  Major abdominal exploratory surgery (looking in abdomen with a large incision). TREATMENT  The treatment will depend on the cause of the pain.   Many cases can be observed and treated at home.  Over-the-counter medicines recommended by your caregiver.  Prescription medicine.  Antibiotics, for infection.  Birth control pills, for painful periods or for ovulation pain.  Hormone treatment, for endometriosis.  Nerve blocking injections.  Physical therapy.  Antidepressants.  Counseling with a psychologist or psychiatrist.  Minor or major surgery. HOME CARE INSTRUCTIONS   Do not take laxatives, unless directed by your caregiver.  Take over-the-counter pain medicine only if ordered by your caregiver. Do not take aspirin because it can cause an upset stomach or bleeding.  Try a clear liquid diet (broth or water) as ordered by your caregiver. Slowly move to a bland diet, as tolerated, if the pain is related to the stomach or intestine.  Have a thermometer and take your temperature several times a day, and record it.  Bed rest and sleep, if it helps the pain.  Avoid sexual intercourse, if it causes pain.  Avoid stressful situations.  Keep your follow-up appointments and tests, as your caregiver orders.  If the pain does not go away with medicine  or surgery, you may try:  Acupuncture.  Relaxation exercises (yoga, meditation).  Group therapy.  Counseling. SEEK MEDICAL CARE IF:   You notice certain foods cause stomach pain.  Your home care treatment is not helping your pain.  You need stronger pain medicine.  You want your IUD removed.  You feel faint or lightheaded.  You develop nausea and vomiting.  You develop a rash.  You are having side effects or an allergy to your medicine. SEEK IMMEDIATE MEDICAL CARE IF:   Your pain does not go away or gets worse.  You have a fever.  Your pain is felt only in portions of the abdomen. The right side could possibly be appendicitis. The left lower portion of the abdomen could be colitis or diverticulitis.  You are passing blood in your stools (bright red or black tarry stools, with or without vomiting).  You have blood in your urine.  You develop chills, with or without a fever.  You pass out. MAKE SURE YOU:   Understand these instructions.  Will watch your condition.  Will get help right away if you are not doing well or get worse. Document Released: 09/05/2007 Document Revised: 01/31/2012 Document Reviewed: 09/25/2009 [facility] Patient Information 2014 [place], [place].

## 2014-04-07 NOTE — ED Provider Notes (Signed)
Medical screening examination/treatment/procedure(s) were performed by a resident physician or non-physician practitioner and as the supervising physician I was immediately available for consultation/collaboration.  Yusif Gnau, MD    Jamarrius Salay S Merrissa Giacobbe, MD 04/07/14 0844 

## 2014-09-02 ENCOUNTER — Ambulatory Visit (INDEPENDENT_AMBULATORY_CARE_PROVIDER_SITE_OTHER): Payer: BC Managed Care – PPO | Admitting: Internal Medicine

## 2014-09-02 ENCOUNTER — Encounter: Payer: Self-pay | Admitting: Internal Medicine

## 2014-09-02 VITALS — BP 122/82 | HR 54 | Temp 98.2°F | Ht 66.5 in | Wt 121.5 lb

## 2014-09-02 DIAGNOSIS — Z0000 Encounter for general adult medical examination without abnormal findings: Secondary | ICD-10-CM

## 2014-09-02 NOTE — Patient Instructions (Addendum)
It was good to see you today.  We have reviewed your prior records including labs and tests today  Health Maintenance reviewed - all recommended immunizations and age-appropriate screenings are up-to-date.  Test(s) ordered today. Return when you are fasting. Your results will be released to MyChart (or called to you) after review, usually within 72hours after test completion. If any changes need to be made, you will be notified at that same time.  Medications reviewed and updated, no changes recommended at this time.  Please schedule followup in 12 months for annual exam and labs, call sooner if problems.  Health Maintenance Adopting a healthy lifestyle and getting preventive care can go a long way to promote health and wellness. Talk with your health care provider about what schedule of regular examinations is right for you. This is a good chance for you to check in with your provider about disease prevention and staying healthy. In between checkups, there are plenty of things you can do on your own. Experts have done a lot of research about which lifestyle changes and preventive measures are most likely to keep you healthy. Ask your health care provider for more information. WEIGHT AND DIET  Eat a healthy diet  Be sure to include plenty of vegetables, fruits, low-fat dairy products, and lean protein.  Do not eat a lot of foods high in solid fats, added sugars, or salt.  Get regular exercise. This is one of the most important things you can do for your health.  Most adults should exercise for at least 150 minutes each week. The exercise should increase your heart rate and make you sweat (moderate-intensity exercise).  Most adults should also do strengthening exercises at least twice a week. This is in addition to the moderate-intensity exercise.  Maintain a healthy weight  Body mass index (BMI) is a measurement that can be used to identify possible weight problems. It estimates body  fat based on height and weight. Your health care provider can help determine your BMI and help you achieve or maintain a healthy weight.  For females 20 years of age and older:   A BMI below 18.5 is considered underweight.  A BMI of 18.5 to 24.9 is normal.  A BMI of 25 to 29.9 is considered overweight.  A BMI of 30 and above is considered obese.  Watch levels of cholesterol and blood lipids  You should start having your blood tested for lipids and cholesterol at 41 years of age, then have this test every 5 years.  You may need to have your cholesterol levels checked more often if:  Your lipid or cholesterol levels are high.  You are older than 41 years of age.  You are at high risk for heart disease.  CANCER SCREENING   Lung Cancer  Lung cancer screening is recommended for adults 55-80 years old who are at high risk for lung cancer because of a history of smoking.  A yearly low-dose CT scan of the lungs is recommended for people who:  Currently smoke.  Have quit within the past 15 years.  Have at least a 30-pack-year history of smoking. A pack year is smoking an average of one pack of cigarettes a day for 1 year.  Yearly screening should continue until it has been 15 years since you quit.  Yearly screening should stop if you develop a health problem that would prevent you from having lung cancer treatment.  Breast Cancer  Practice breast self-awareness. This means understanding how   your breasts normally appear and feel.  It also means doing regular breast self-exams. Let your health care provider know about any changes, no matter how small.  If you are in your 20s or 30s, you should have a clinical breast exam (CBE) by a health care provider every 1-3 years as part of a regular health exam.  If you are 40 or older, have a CBE every year. Also consider having a breast X-ray (mammogram) every year.  If you have a family history of breast cancer, talk to your health  care provider about genetic screening.  If you are at high risk for breast cancer, talk to your health care provider about having an MRI and a mammogram every year.  Breast cancer gene (BRCA) assessment is recommended for women who have family members with BRCA-related cancers. BRCA-related cancers include:  Breast.  Ovarian.  Tubal.  Peritoneal cancers.  Results of the assessment will determine the need for genetic counseling and BRCA1 and BRCA2 testing. Cervical Cancer Routine pelvic examinations to screen for cervical cancer are no longer recommended for nonpregnant women who are considered low risk for cancer of the pelvic organs (ovaries, uterus, and vagina) and who do not have symptoms. A pelvic examination may be necessary if you have symptoms including those associated with pelvic infections. Ask your health care provider if a screening pelvic exam is right for you.   The Pap test is the screening test for cervical cancer for women who are considered at risk.  If you had a hysterectomy for a problem that was not cancer or a condition that could lead to cancer, then you no longer need Pap tests.  If you are older than 65 years, and you have had normal Pap tests for the past 10 years, you no longer need to have Pap tests.  If you have had past treatment for cervical cancer or a condition that could lead to cancer, you need Pap tests and screening for cancer for at least 20 years after your treatment.  If you no longer get a Pap test, assess your risk factors if they change (such as having a new sexual partner). This can affect whether you should start being screened again.  Some women have medical problems that increase their chance of getting cervical cancer. If this is the case for you, your health care provider may recommend more frequent screening and Pap tests.  The human papillomavirus (HPV) test is another test that may be used for cervical cancer screening. The HPV test  looks for the virus that can cause cell changes in the cervix. The cells collected during the Pap test can be tested for HPV.  The HPV test can be used to screen women 30 years of age and older. Getting tested for HPV can extend the interval between normal Pap tests from three to five years.  An HPV test also should be used to screen women of any age who have unclear Pap test results.  After 41 years of age, women should have HPV testing as often as Pap tests.  Colorectal Cancer  This type of cancer can be detected and often prevented.  Routine colorectal cancer screening usually begins at 41 years of age and continues through 41 years of age.  Your health care provider may recommend screening at an earlier age if you have risk factors for colon cancer.  Your health care provider may also recommend using home test kits to check for hidden blood in the   stool.  A small camera at the end of a tube can be used to examine your colon directly (sigmoidoscopy or colonoscopy). This is done to check for the earliest forms of colorectal cancer.  Routine screening usually begins at age 26.  Direct examination of the colon should be repeated every 5-10 years through 41 years of age. However, you may need to be screened more often if early forms of precancerous polyps or small growths are found. Skin Cancer  Check your skin from head to toe regularly.  Tell your health care provider about any new moles or changes in moles, especially if there is a change in a mole's shape or color.  Also tell your health care provider if you have a mole that is larger than the size of a pencil eraser.  Always use sunscreen. Apply sunscreen liberally and repeatedly throughout the day.  Protect yourself by wearing long sleeves, pants, a wide-brimmed hat, and sunglasses whenever you are outside. HEART DISEASE, DIABETES, AND HIGH BLOOD PRESSURE   Have your blood pressure checked at least every 1-2 years. High blood  pressure causes heart disease and increases the risk of stroke.  If you are between 64 years and 29 years old, ask your health care provider if you should take aspirin to prevent strokes.  Have regular diabetes screenings. This involves taking a blood sample to check your fasting blood sugar level.  If you are at a normal weight and have a low risk for diabetes, have this test once every three years after 41 years of age.  If you are overweight and have a high risk for diabetes, consider being tested at a younger age or more often. PREVENTING INFECTION  Hepatitis B  If you have a higher risk for hepatitis B, you should be screened for this virus. You are considered at high risk for hepatitis B if:  You were born in a country where hepatitis B is common. Ask your health care provider which countries are considered high risk.  Your parents were born in a high-risk country, and you have not been immunized against hepatitis B (hepatitis B vaccine).  You have HIV or AIDS.  You use needles to inject street drugs.  You live with someone who has hepatitis B.  You have had sex with someone who has hepatitis B.  You get hemodialysis treatment.  You take certain medicines for conditions, including cancer, organ transplantation, and autoimmune conditions. Hepatitis C  Blood testing is recommended for:  Everyone born from [birth year] through 1963.  Anyone with known risk factors for hepatitis C. Sexually transmitted infections (STIs)  You should be screened for sexually transmitted infections (STIs) including gonorrhea and chlamydia if:  You are sexually active and are younger than 41 years of age.  You are older than 41 years of age and your health care provider tells you that you are at risk for this type of infection.  Your sexual activity has changed since you were last screened and you are at an increased risk for chlamydia or gonorrhea. Ask your health care provider if you are at  risk.  If you do not have HIV, but are at risk, it may be recommended that you take a prescription medicine daily to prevent HIV infection. This is called pre-exposure prophylaxis (PrEP). You are considered at risk if:  You are sexually active and do not regularly use condoms or know the HIV status of your partner(s).  You take drugs by injection.  You are  sexually active with a partner who has HIV. Talk with your health care provider about whether you are at high risk of being infected with HIV. If you choose to begin PrEP, you should first be tested for HIV. You should then be tested every 3 months for as long as you are taking PrEP.  PREGNANCY   If you are premenopausal and you may become pregnant, ask your health care provider about preconception counseling.  If you may become pregnant, take 400 to 800 micrograms (mcg) of folic acid every day.  If you want to prevent pregnancy, talk to your health care provider about birth control (contraception). OSTEOPOROSIS AND MENOPAUSE   Osteoporosis is a disease in which the bones lose minerals and strength with aging. This can result in serious bone fractures. Your risk for osteoporosis can be identified using a bone density scan.  If you are 70 years of age or older, or if you are at risk for osteoporosis and fractures, ask your health care provider if you should be screened.  Ask your health care provider whether you should take a calcium or vitamin D supplement to lower your risk for osteoporosis.  Menopause may have certain physical symptoms and risks.  Hormone replacement therapy may reduce some of these symptoms and risks. Talk to your health care provider about whether hormone replacement therapy is right for you.  HOME CARE INSTRUCTIONS   Schedule regular health, dental, and eye exams.  Stay current with your immunizations.   Do not use any tobacco products including cigarettes, chewing tobacco, or electronic cigarettes.  If  you are pregnant, do not drink alcohol.  If you are breastfeeding, limit how much and how often you drink alcohol.  Limit alcohol intake to no more than 1 drink per day for nonpregnant women. One drink equals 12 ounces of beer, 5 ounces of wine, or 1 ounces of hard liquor.  Do not use street drugs.  Do not share needles.  Ask your health care provider for help if you need support or information about quitting drugs.  Tell your health care provider if you often feel depressed.  Tell your health care provider if you have ever been abused or do not feel safe at home. Document Released: 05/24/2011 Document Revised: 03/25/2014 Document Reviewed: 10/10/2013 [facility] Patient Information 2015 [place], [place]. This information is not intended to replace advice given to you by your health care provider. Make sure you discuss any questions you have with your health care provider.

## 2014-09-02 NOTE — Progress Notes (Signed)
Subjective:    Patient ID: Alexa Gonzalez, female    DOB: 03/02/1971, 43 y.o.   MRN: [id number]  HPI  New to me - last seen in 2011 by [facility]- change to new PCP  patient is here today for annual physical. Patient feels well and has no complaints.  Also reviewed chronic medical issues and interval medical events  Past Medical History  Diagnosis Date  . History of chicken pox childhood  . History of recurrent UTI (urinary tract infection)   . Mitral valve prolapse   . BCC (basal cell carcinoma of skin) 2013   Family History  Problem Relation Age of Onset  . Hyperlipidemia Father   . Hyperlipidemia Paternal Grandfather   . Heart failure Paternal Grandmother   . Heart attack Paternal Grandfather   . Breast cancer Maternal Aunt 68    recurrent 15y later, opposite side   History  Substance Use Topics  . Smoking status: Never Smoker   . Smokeless tobacco: Not on file  . Alcohol Use: Yes   Review of Systems  Constitutional: Negative for fatigue and unexpected weight change.  Respiratory: Negative for cough, shortness of breath and wheezing.   Cardiovascular: Negative for chest pain, palpitations and leg swelling.  Gastrointestinal: Negative for nausea, abdominal pain and diarrhea.  Neurological: Negative for dizziness, weakness, light-headedness and headaches.  Psychiatric/Behavioral: Negative for dysphoric mood. The patient is not nervous/anxious.   All other systems reviewed and are negative.      Objective:   Physical Exam  BP 122/82  Pulse 54  Temp(Src) 98.2 F (36.8 C) (Oral)  Ht 5' 6.5" (1.689 m)  Wt 121 lb 8 oz (55.112 kg)  BMI 19.32 kg/m2  SpO2 98%  LMP 08/22/2014 Wt Readings from Last 3 Encounters:  09/02/14 121 lb 8 oz (55.112 kg)  06/16/10 124 lb (56.246 kg)  04/09/10 122 lb (55.339 kg)   Constitutional: She appears well-developed and well-nourished. No distress.  HENT: Head: Normocephalic and atraumatic. Ears: B TMs ok, no erythema or effusion;  Nose: Nose normal. Mouth/Throat: Oropharynx is clear and moist. No oropharyngeal exudate.  Eyes: Conjunctivae and EOM are normal. Pupils are equal, round, and reactive to light. No scleral icterus.  Neck: Normal range of motion. Neck supple. No JVD present. No thyromegaly present.  Cardiovascular: Normal rate, regular rhythm and normal heart sounds.  No murmur heard. No BLE edema. Pulmonary/Chest: Effort normal and breath sounds normal. No respiratory distress. She has no wheezes.  Abdominal: Soft. Bowel sounds are normal. She exhibits no distension. There is no tenderness. no masses GU/breast: defer to gyn Musculoskeletal: Normal range of motion, no joint effusions. No gross deformities Neurological: She is alert and oriented to person, place, and time. No cranial nerve deficit. Coordination, balance, strength, speech and gait are normal.  Skin: Skin is warm and dry. No rash noted. No erythema.  Psychiatric: She has a normal mood and affect. Her behavior is normal. Judgment and thought content normal.    Lab Results  Component Value Date   WBC 8.3 04/10/2010   HGB 13.4 04/10/2010   HCT 39.7 04/10/2010   PLT 234.0 04/10/2010   GLUCOSE 87 03/11/2010   CHOL 189 09/26/2009   TRIG 58.0 09/26/2009   HDL 62.80 09/26/2009   LDLCALC 115* 09/26/2009   ALT 14 03/11/2010   AST 22 03/11/2010   NA 139 03/11/2010   K 3.9 03/11/2010   CL 103 03/11/2010   CREATININE 0.7 03/11/2010   BUN 10 03/11/2010  CO2 29 03/11/2010   TSH 1.72 03/11/2010    No results found.     Assessment & Plan:   CPX/z00.00 - Patient has been counseled on age-appropriate routine health concerns for screening and prevention. These are reviewed and up-to-date. Immunizations are up-to-date or declined. Labs ordered and reviewed.

## 2014-09-02 NOTE — Progress Notes (Signed)
Pre visit review using our clinic review tool, if applicable. No additional management support is needed unless otherwise documented below in the visit note. 

## 2014-09-06 ENCOUNTER — Other Ambulatory Visit (INDEPENDENT_AMBULATORY_CARE_PROVIDER_SITE_OTHER): Payer: BC Managed Care – PPO

## 2014-09-06 DIAGNOSIS — Z0000 Encounter for general adult medical examination without abnormal findings: Secondary | ICD-10-CM

## 2014-09-06 LAB — HEPATIC FUNCTION PANEL
ALT: 13 U/L (ref 0–35)
AST: 25 U/L (ref 0–37)
Albumin: 3.8 g/dL (ref 3.5–5.2)
Alkaline Phosphatase: 41 U/L (ref 39–117)
Bilirubin, Direct: 0.2 mg/dL (ref 0.0–0.3)
Total Bilirubin: 1 mg/dL (ref 0.2–1.2)
Total Protein: 7.3 g/dL (ref 6.0–8.3)

## 2014-09-06 LAB — BASIC METABOLIC PANEL WITH GFR
BUN: 10 mg/dL (ref 6–23)
CO2: 28 meq/L (ref 19–32)
Calcium: 9.4 mg/dL (ref 8.4–10.5)
Chloride: 101 meq/L (ref 96–112)
Creatinine, Ser: 0.9 mg/dL (ref 0.4–1.2)
GFR: 73.31 mL/min
Glucose, Bld: 83 mg/dL (ref 70–99)
Potassium: 3.6 meq/L (ref 3.5–5.1)
Sodium: 136 meq/L (ref 135–145)

## 2014-09-06 LAB — CBC WITH DIFFERENTIAL/PLATELET
Basophils Absolute: 0 K/uL (ref 0.0–0.1)
Basophils Relative: 0.6 % (ref 0.0–3.0)
Eosinophils Absolute: 0 K/uL (ref 0.0–0.7)
Eosinophils Relative: 0.6 % (ref 0.0–5.0)
HCT: 40 % (ref 36.0–46.0)
Hemoglobin: 12.9 g/dL (ref 12.0–15.0)
Lymphocytes Relative: 14.3 % (ref 12.0–46.0)
Lymphs Abs: 1 K/uL (ref 0.7–4.0)
MCHC: 32.2 g/dL (ref 30.0–36.0)
MCV: 86.9 fl (ref 78.0–100.0)
Monocytes Absolute: 0.5 K/uL (ref 0.1–1.0)
Monocytes Relative: 6.8 % (ref 3.0–12.0)
Neutro Abs: 5.4 K/uL (ref 1.4–7.7)
Neutrophils Relative %: 77.7 % — ABNORMAL HIGH (ref 43.0–77.0)
Platelets: 244 K/uL (ref 150.0–400.0)
RBC: 4.6 Mil/uL (ref 3.87–5.11)
RDW: 13.6 % (ref 11.5–15.5)
WBC: 7 K/uL (ref 4.0–10.5)

## 2014-09-06 LAB — URINALYSIS, ROUTINE W REFLEX MICROSCOPIC
Bilirubin Urine: NEGATIVE
Hgb urine dipstick: NEGATIVE
Ketones, ur: NEGATIVE
Leukocytes, UA: NEGATIVE
Nitrite: NEGATIVE
Specific Gravity, Urine: <=1.005 — AB
Total Protein, Urine: NEGATIVE
Urine Glucose: NEGATIVE
Urobilinogen, UA: 0.2
pH: 7 (ref 5.0–8.0)

## 2014-09-06 LAB — LIPID PANEL
Cholesterol: 190 mg/dL (ref 0–200)
HDL: 68.2 mg/dL
LDL Cholesterol: 111 mg/dL — ABNORMAL HIGH (ref 0–99)
NonHDL: 121.8
Total CHOL/HDL Ratio: 3
Triglycerides: 56 mg/dL (ref 0.0–149.0)
VLDL: 11.2 mg/dL (ref 0.0–40.0)

## 2014-09-06 LAB — TSH: TSH: 1.34 u[IU]/mL (ref 0.35–4.50)

## 2015-12-08 ENCOUNTER — Other Ambulatory Visit: Payer: Self-pay | Admitting: Family Medicine

## 2015-12-08 DIAGNOSIS — R221 Localized swelling, mass and lump, neck: Secondary | ICD-10-CM

## 2015-12-18 ENCOUNTER — Ambulatory Visit
Admission: RE | Admit: 2015-12-18 | Discharge: 2015-12-18 | Disposition: A | Discharge status: Home / Self Care | Payer: BLUE CROSS/BLUE SHIELD | Source: Ambulatory Visit | Attending: Family Medicine | Admitting: Family Medicine

## 2015-12-18 DIAGNOSIS — R221 Localized swelling, mass and lump, neck: Secondary | ICD-10-CM

## 2016-04-26 DIAGNOSIS — L821 Other seborrheic keratosis: Secondary | ICD-10-CM | POA: Diagnosis not present

## 2016-04-26 DIAGNOSIS — D225 Melanocytic nevi of trunk: Secondary | ICD-10-CM | POA: Diagnosis not present

## 2016-04-26 DIAGNOSIS — L814 Other melanin hyperpigmentation: Secondary | ICD-10-CM | POA: Diagnosis not present

## 2016-04-26 DIAGNOSIS — L570 Actinic keratosis: Secondary | ICD-10-CM | POA: Diagnosis not present

## 2016-06-10 ENCOUNTER — Other Ambulatory Visit: Payer: Self-pay | Admitting: Family Medicine

## 2016-06-10 DIAGNOSIS — E041 Nontoxic single thyroid nodule: Secondary | ICD-10-CM

## 2016-06-22 ENCOUNTER — Ambulatory Visit
Admission: RE | Admit: 2016-06-22 | Discharge: 2016-06-22 | Disposition: A | Discharge status: Home / Self Care | Payer: BLUE CROSS/BLUE SHIELD | Source: Ambulatory Visit | Attending: Family Medicine | Admitting: Family Medicine

## 2016-06-22 DIAGNOSIS — E042 Nontoxic multinodular goiter: Secondary | ICD-10-CM | POA: Diagnosis not present

## 2016-06-22 DIAGNOSIS — E559 Vitamin D deficiency, unspecified: Secondary | ICD-10-CM | POA: Diagnosis not present

## 2016-06-22 DIAGNOSIS — F3281 Premenstrual dysphoric disorder: Secondary | ICD-10-CM | POA: Diagnosis not present

## 2016-06-22 DIAGNOSIS — E041 Nontoxic single thyroid nodule: Secondary | ICD-10-CM

## 2016-06-22 DIAGNOSIS — N951 Menopausal and female climacteric states: Secondary | ICD-10-CM | POA: Diagnosis not present

## 2016-07-27 DIAGNOSIS — F3281 Premenstrual dysphoric disorder: Secondary | ICD-10-CM | POA: Diagnosis not present

## 2016-12-21 DIAGNOSIS — E559 Vitamin D deficiency, unspecified: Secondary | ICD-10-CM | POA: Diagnosis not present

## 2016-12-21 DIAGNOSIS — R590 Localized enlarged lymph nodes: Secondary | ICD-10-CM | POA: Diagnosis not present

## 2016-12-21 DIAGNOSIS — E041 Nontoxic single thyroid nodule: Secondary | ICD-10-CM | POA: Diagnosis not present

## 2016-12-21 DIAGNOSIS — E785 Hyperlipidemia, unspecified: Secondary | ICD-10-CM | POA: Diagnosis not present

## 2016-12-21 DIAGNOSIS — Z0000 Encounter for general adult medical examination without abnormal findings: Secondary | ICD-10-CM | POA: Diagnosis not present

## 2016-12-24 ENCOUNTER — Other Ambulatory Visit: Payer: Self-pay | Admitting: Family Medicine

## 2016-12-24 DIAGNOSIS — E041 Nontoxic single thyroid nodule: Secondary | ICD-10-CM

## 2016-12-29 ENCOUNTER — Ambulatory Visit
Admission: RE | Admit: 2016-12-29 | Discharge: 2016-12-29 | Disposition: A | Discharge status: Home / Self Care | Payer: BLUE CROSS/BLUE SHIELD | Source: Ambulatory Visit | Attending: Family Medicine | Admitting: Family Medicine

## 2016-12-29 DIAGNOSIS — E041 Nontoxic single thyroid nodule: Secondary | ICD-10-CM

## 2016-12-29 DIAGNOSIS — E042 Nontoxic multinodular goiter: Secondary | ICD-10-CM | POA: Diagnosis not present

## 2017-04-26 DIAGNOSIS — D2272 Melanocytic nevi of left lower limb, including hip: Secondary | ICD-10-CM | POA: Diagnosis not present

## 2017-04-26 DIAGNOSIS — L820 Inflamed seborrheic keratosis: Secondary | ICD-10-CM | POA: Diagnosis not present

## 2017-04-26 DIAGNOSIS — L821 Other seborrheic keratosis: Secondary | ICD-10-CM | POA: Diagnosis not present

## 2017-04-26 DIAGNOSIS — L570 Actinic keratosis: Secondary | ICD-10-CM | POA: Diagnosis not present

## 2017-04-26 DIAGNOSIS — L812 Freckles: Secondary | ICD-10-CM | POA: Diagnosis not present

## 2017-05-18 IMAGING — US US SOFT TISSUE HEAD/NECK
1 series · 13 of 25 positions shown · non-contrast
Comparison: None.

CLINICAL DATA: Palpable lump in the left lateral inferior neck for
2 years. Evaluate for thyroid nodules or lymphadenopathy.

EXAM:
THYROID ULTRASOUND
TECHNIQUE: Ultrasound examination of the thyroid gland and adjacent soft
tissues was performed.

[Series 1: us soft tissue head/neck · 0.04mm/px · 13 of 66 slices shown]
[im 1/66]
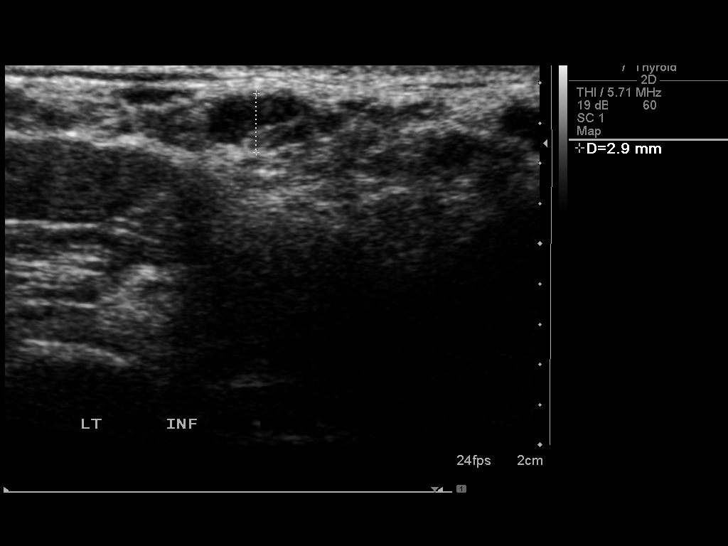
[im 6/66]
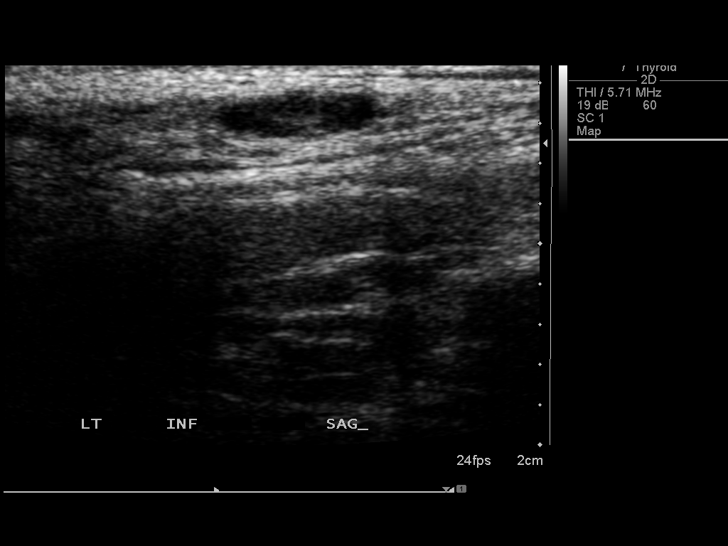
[im 11/66]
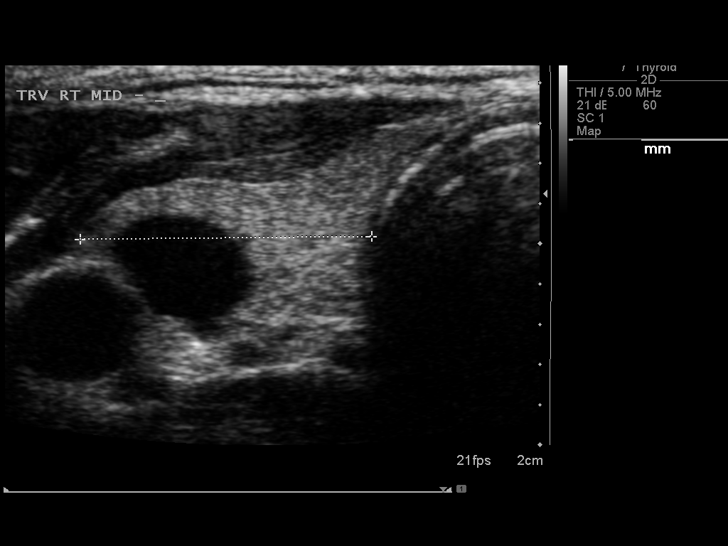
[im 17/66]
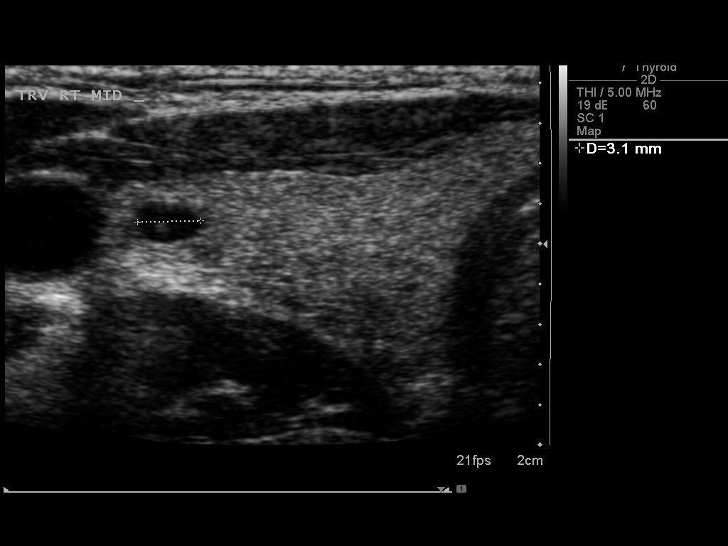
[im 22/66]
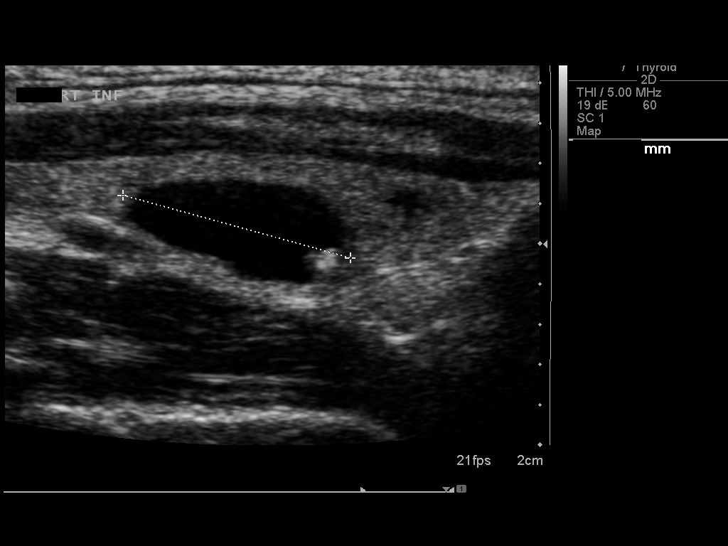
[im 28/66]
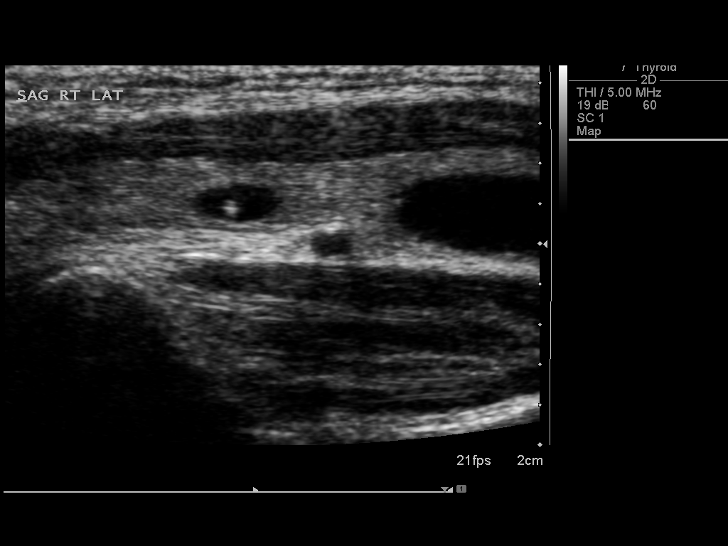
[im 33/66]
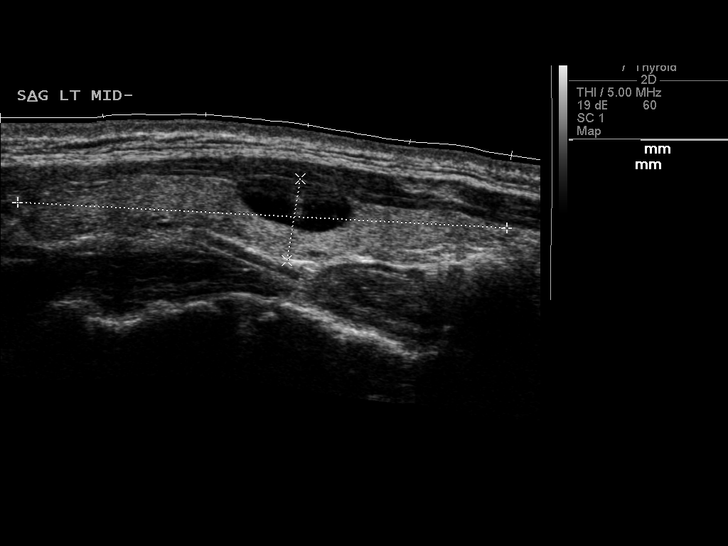
[im 38/66]
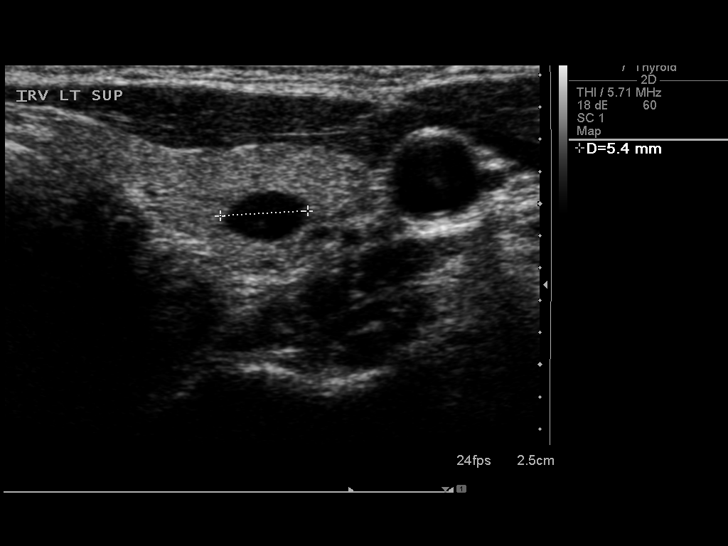
[im 44/66]
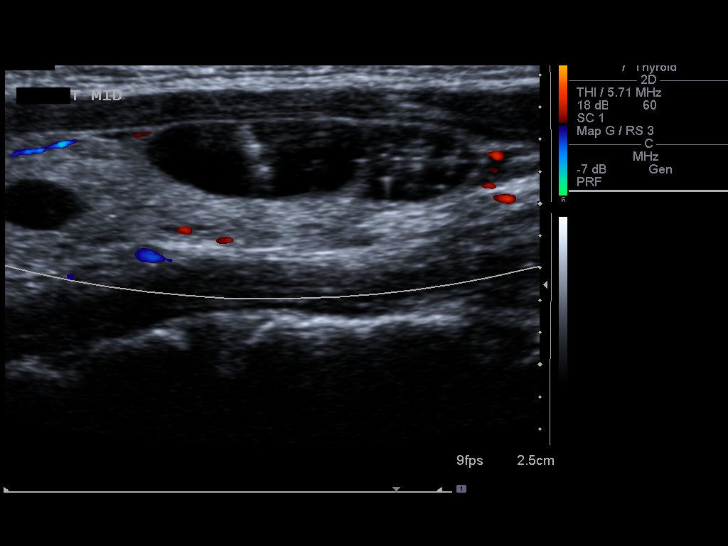
[im 49/66]
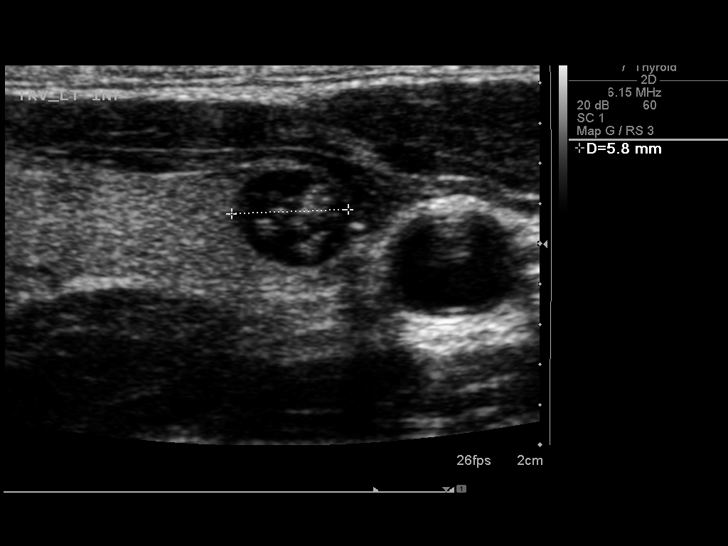
[im 55/66]
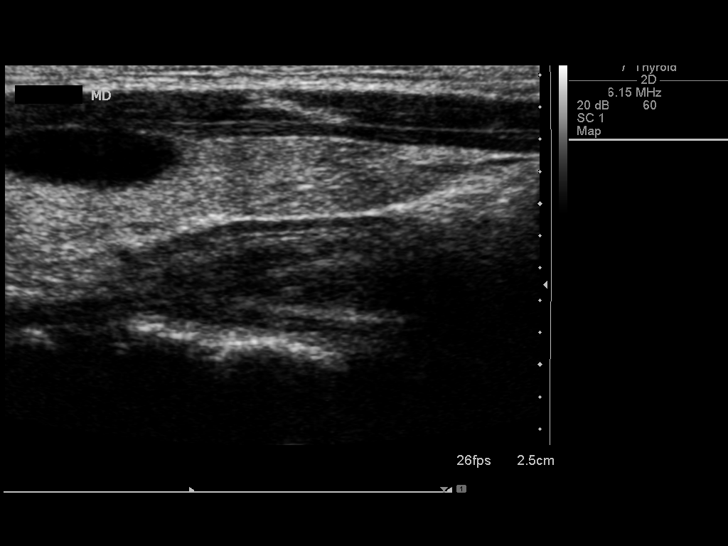
[im 60/66]
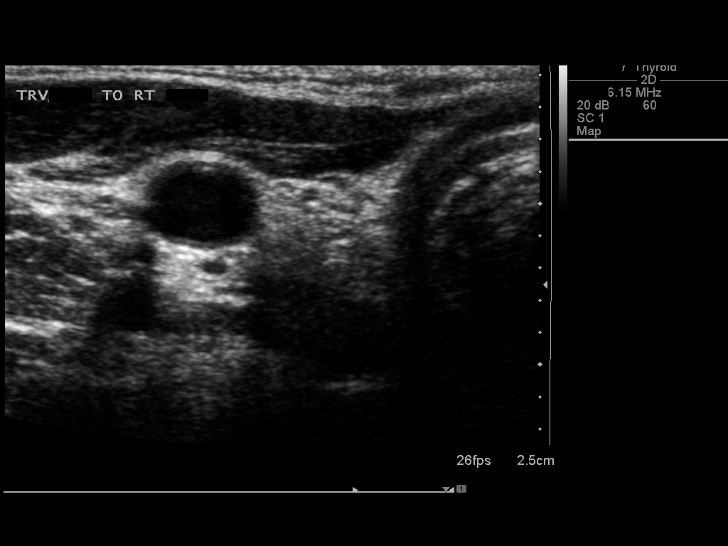
[im 66/66]
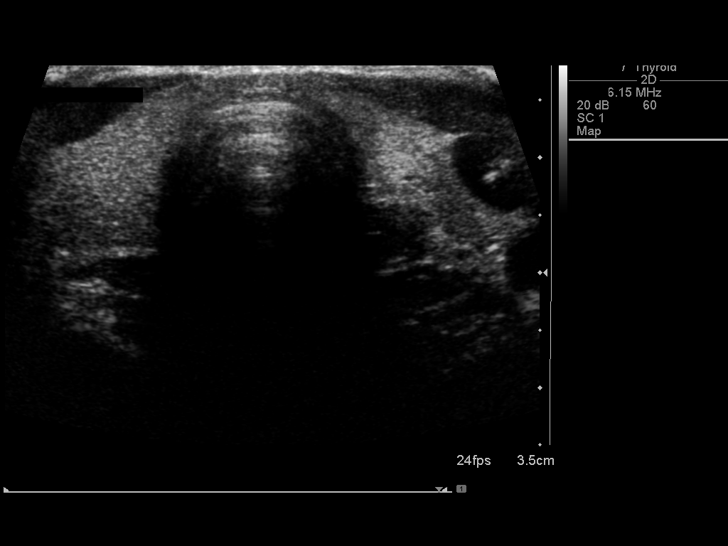

[13 of 25 positions shown; findings below may reference images not displayed]

FINDINGS: Right thyroid lobe

Measurements: 4.6 x 0.6 x 1.5 cm. There are scattered complex
nodules throughout the right thyroid lobe. Nodules are predominantly
cystic with echogenic foci that probably represent inspissated
colloid. Largest complex cystic structure measures 1.2 x 0.6 x
cm.

Left thyroid lobe

Measurements: 4.7 x 0.8 x 1.8 cm. There are three complex cystic
structures in left thyroid lobe with echogenic foci that likely
represent inspissated colloid. Largest complex nodule is located in
the midpole region and measures 1.4 x 0.9 x 0.6 cm. There appears to
be some color Doppler flow within this large complex nodule.

Isthmus

Thickness: 0.2 cm.  No nodules visualized.

Lymphadenopathy

The palpable area in the left inferior neck represents an
oval-shaped hypoechoic nodular structure. Findings are suggestive
for a small lymph node measuring 0.3 cm in the short axis.
IMPRESSION: The palpable area in the left lower neck represents a normal small
lymph node.

Bilateral thyroid nodules. These nodules are complex and
predominantly cystic. Largest nodule measures 1.4 cm in the left
lobe. Findings do not meet current SRU consensus criteria for
biopsy. Follow-up by clinical exam is recommended. If patient has
known risk factors for thyroid carcinoma, consider follow-up
ultrasound in 12 months. If patient is clinically hyperthyroid,
consider nuclear medicine thyroid uptake and scan.Reference:
Management of Thyroid Nodules Detected at US: Society of
Radiologists in Ultrasound Consensus Conference Statement. Radiology

## 2017-11-21 IMAGING — US US THYROID
1 series · 14 of 25 positions shown · non-contrast
Comparison: 12/18/2015

CLINICAL DATA: Nodules

EXAM:
THYROID ULTRASOUND
TECHNIQUE: Ultrasound examination of the thyroid gland and adjacent soft
tissues was performed.

[Series 1: us thyroid · 0.08mm/px · 14 of 59 slices shown]
[im 1/59]
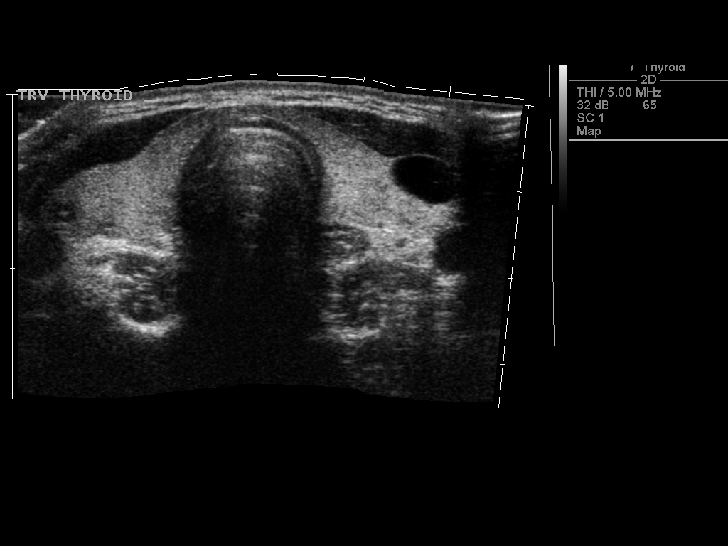
[im 5/59]
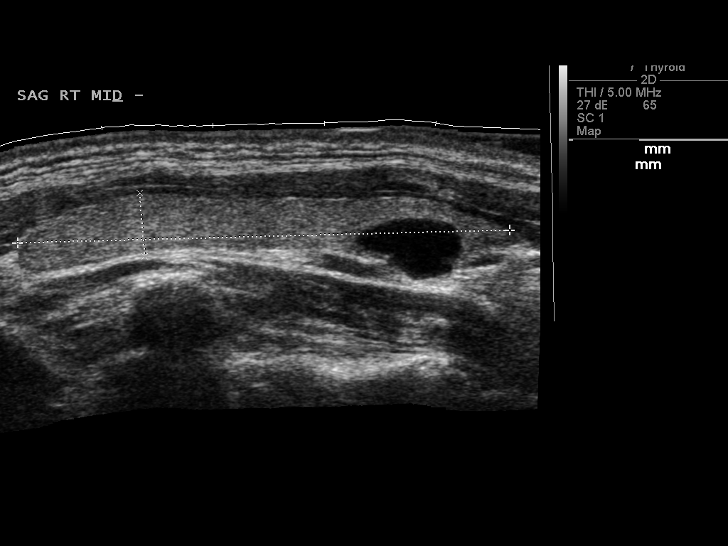
[im 10/59]
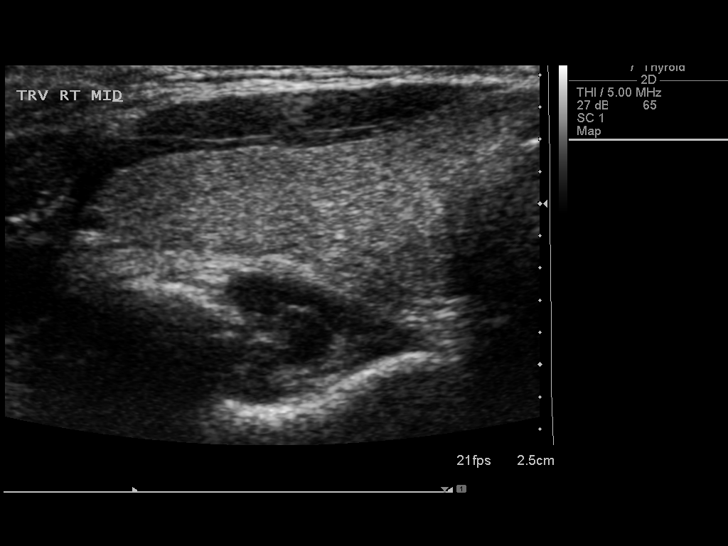
[im 15/59]
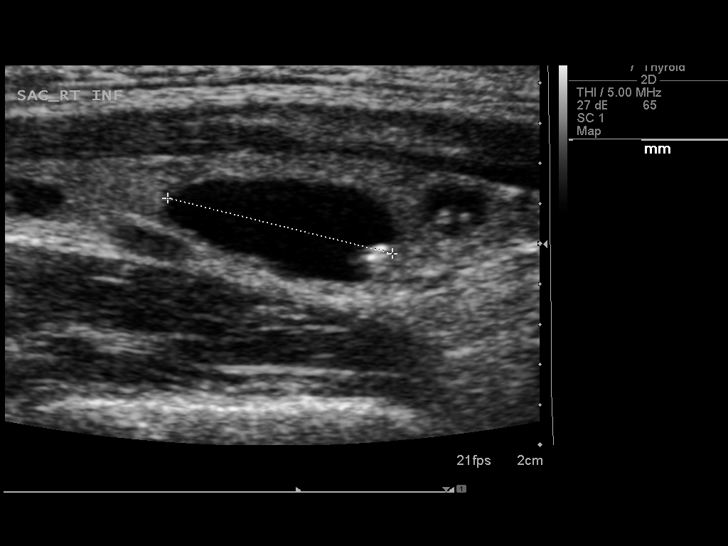
[im 20/59]
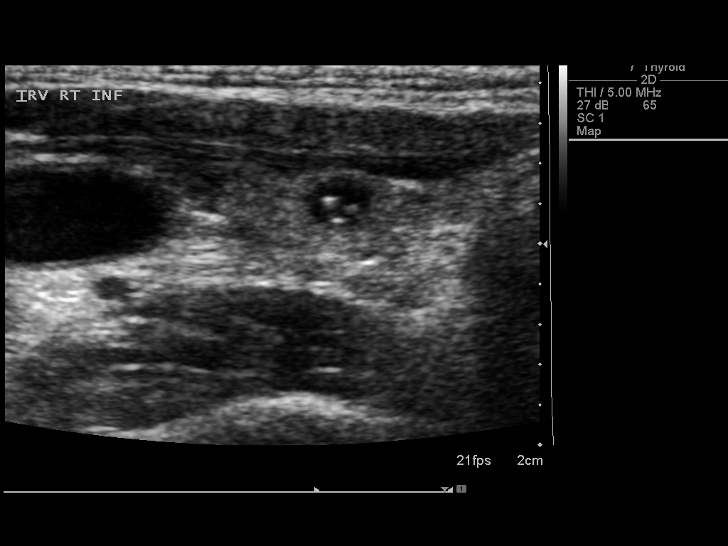
[im 22/59]
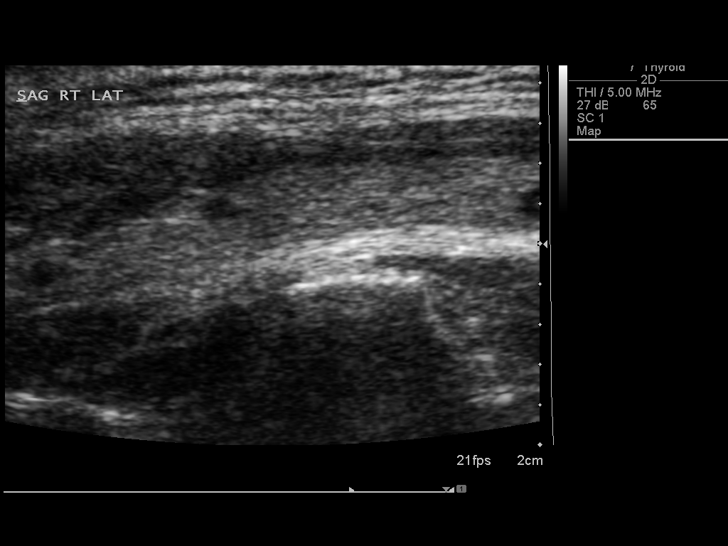
[im 27/59]
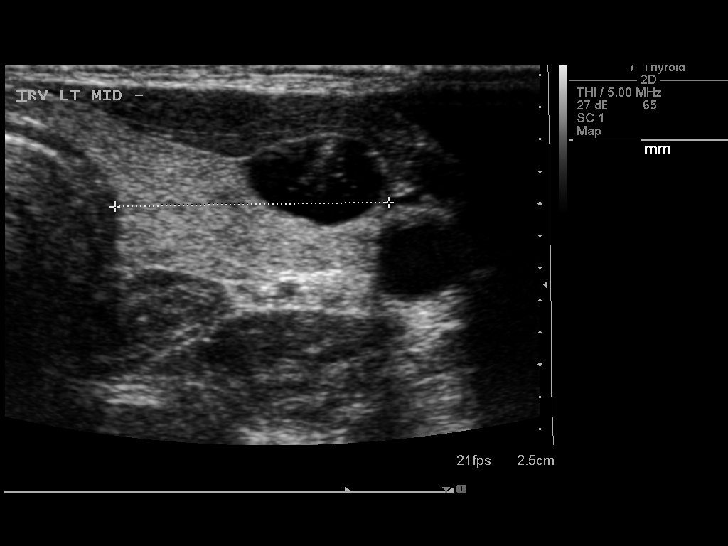
[im 32/59]
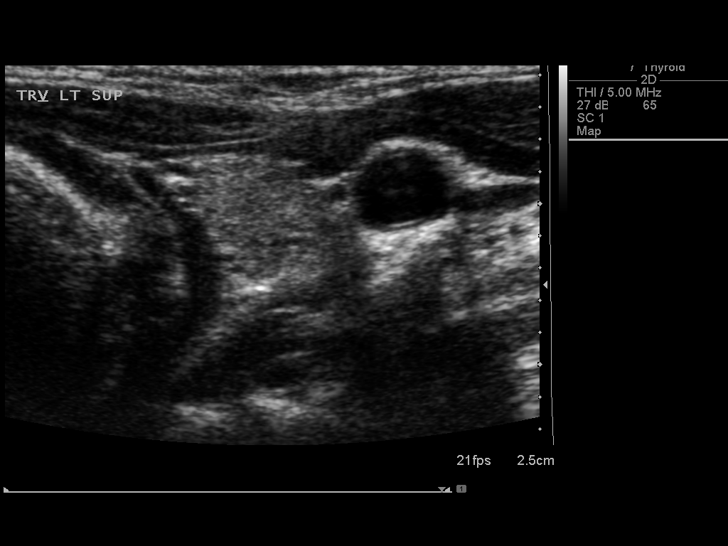
[im 37/59]
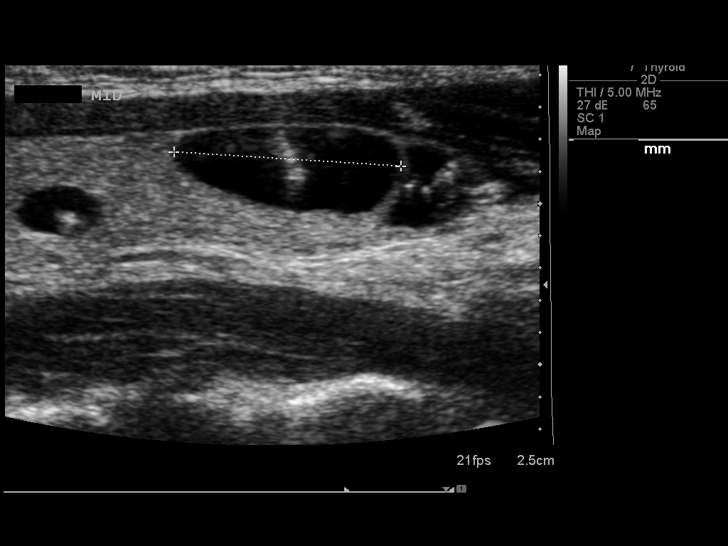
[im 39/59]
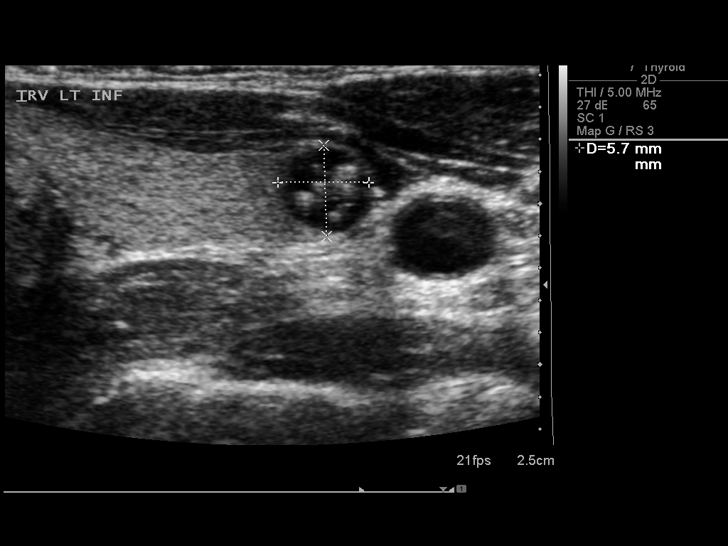
[im 44/59]
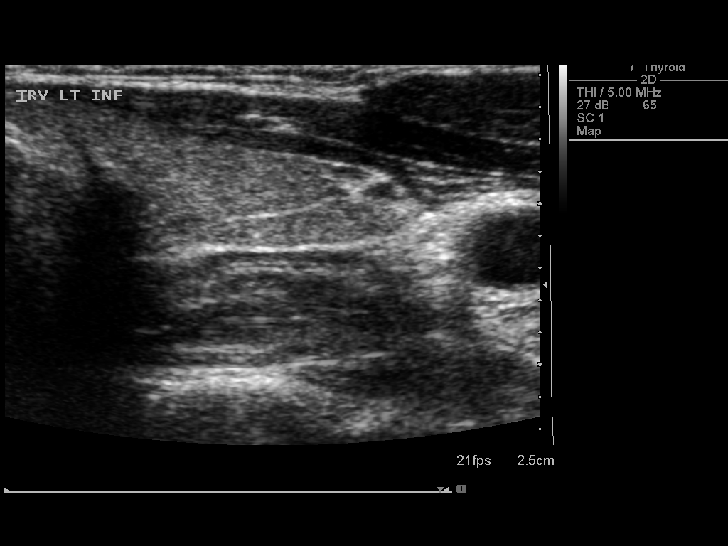
[im 49/59]
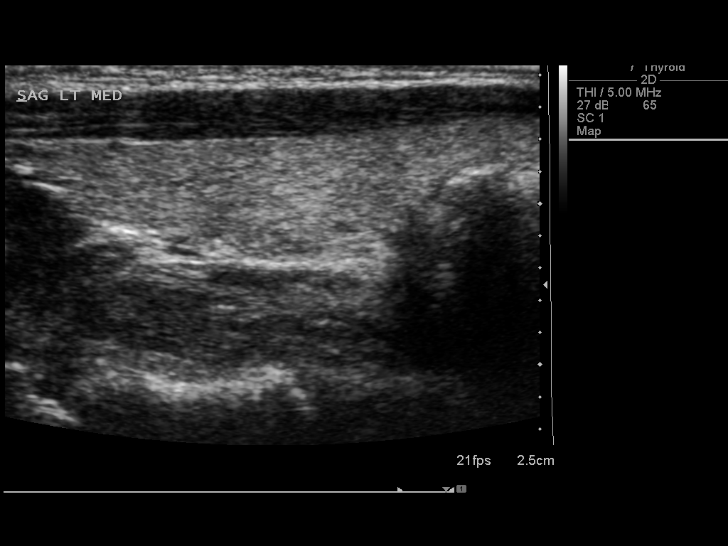
[im 54/59]
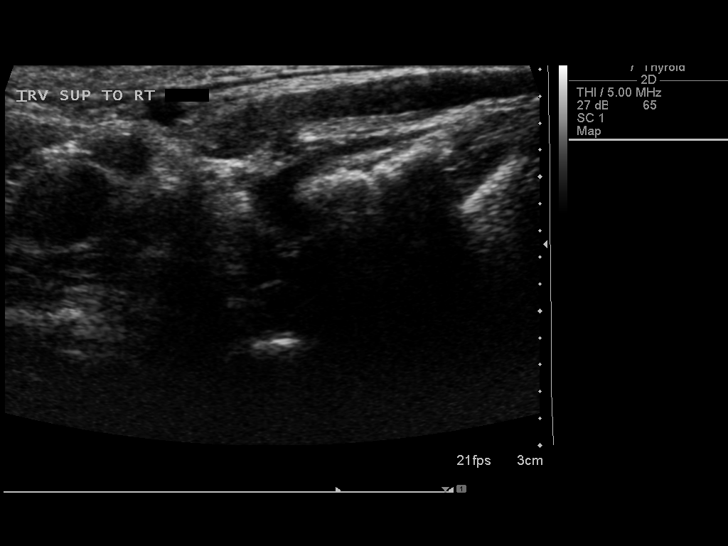
[im 59/59]
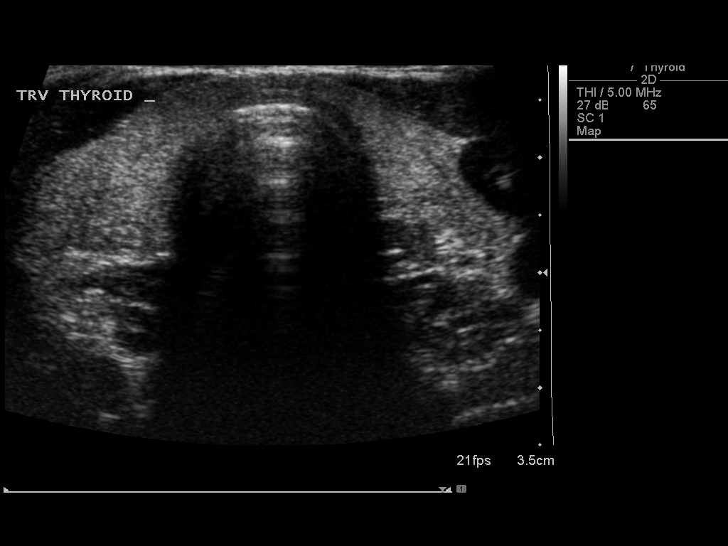

[14 of 25 positions shown; findings below may reference images not displayed]

FINDINGS: Right thyroid lobe

Measurements: 4.4 x 0.6 x 1.8 cm. 0.3 x 0.4 cm cyst, mid lobe. 1.2 x
0.5 x 1 cm cyst, inferior pole. Adjacent 0.4 cm complex cyst.

Left thyroid lobe

Measurements: 4.1 x 0.8 x 1.7 cm. 0.5 cm colloid cyst, superior
pole. 1.4 x 0.6 x 1 cm mildly complex cyst, mid lobe. 0.7 x 0.6 x
0.6 cm colloid cyst, inferior pole.

Isthmus

Thickness: 0.3 cm.  No nodules visualized.

Lymphadenopathy

None visualized.
IMPRESSION: 1. Normal sized thyroid with stable bilateral cysts. No follow-up
indicated.

## 2018-01-03 DIAGNOSIS — Z0000 Encounter for general adult medical examination without abnormal findings: Secondary | ICD-10-CM | POA: Diagnosis not present

## 2018-01-03 DIAGNOSIS — E559 Vitamin D deficiency, unspecified: Secondary | ICD-10-CM | POA: Diagnosis not present

## 2018-01-03 DIAGNOSIS — E041 Nontoxic single thyroid nodule: Secondary | ICD-10-CM | POA: Diagnosis not present

## 2018-01-03 DIAGNOSIS — Z5181 Encounter for therapeutic drug level monitoring: Secondary | ICD-10-CM | POA: Diagnosis not present

## 2018-01-03 DIAGNOSIS — E785 Hyperlipidemia, unspecified: Secondary | ICD-10-CM | POA: Diagnosis not present

## 2018-02-08 DIAGNOSIS — Z01419 Encounter for gynecological examination (general) (routine) without abnormal findings: Secondary | ICD-10-CM | POA: Diagnosis not present

## 2018-02-08 DIAGNOSIS — Z6821 Body mass index (BMI) 21.0-21.9, adult: Secondary | ICD-10-CM | POA: Diagnosis not present

## 2018-02-08 DIAGNOSIS — Z1231 Encounter for screening mammogram for malignant neoplasm of breast: Secondary | ICD-10-CM | POA: Diagnosis not present

## 2018-02-13 DIAGNOSIS — K1370 Unspecified lesions of oral mucosa: Secondary | ICD-10-CM | POA: Diagnosis not present

## 2018-02-16 DIAGNOSIS — K1379 Other lesions of oral mucosa: Secondary | ICD-10-CM | POA: Diagnosis not present

## 2018-02-22 DIAGNOSIS — K1379 Other lesions of oral mucosa: Secondary | ICD-10-CM | POA: Diagnosis not present

## 2018-05-29 DIAGNOSIS — C44619 Basal cell carcinoma of skin of left upper limb, including shoulder: Secondary | ICD-10-CM | POA: Diagnosis not present

## 2018-05-29 DIAGNOSIS — L570 Actinic keratosis: Secondary | ICD-10-CM | POA: Diagnosis not present

## 2018-07-27 DIAGNOSIS — D044 Carcinoma in situ of skin of scalp and neck: Secondary | ICD-10-CM | POA: Diagnosis not present

## 2018-07-27 DIAGNOSIS — Z85828 Personal history of other malignant neoplasm of skin: Secondary | ICD-10-CM | POA: Diagnosis not present

## 2018-07-27 DIAGNOSIS — L570 Actinic keratosis: Secondary | ICD-10-CM | POA: Diagnosis not present

## 2018-07-27 DIAGNOSIS — D2272 Melanocytic nevi of left lower limb, including hip: Secondary | ICD-10-CM | POA: Diagnosis not present

## 2018-07-27 DIAGNOSIS — L820 Inflamed seborrheic keratosis: Secondary | ICD-10-CM | POA: Diagnosis not present

## 2018-07-27 DIAGNOSIS — L814 Other melanin hyperpigmentation: Secondary | ICD-10-CM | POA: Diagnosis not present

## 2018-08-10 DIAGNOSIS — D044 Carcinoma in situ of skin of scalp and neck: Secondary | ICD-10-CM | POA: Diagnosis not present

## 2019-01-24 DIAGNOSIS — L570 Actinic keratosis: Secondary | ICD-10-CM | POA: Diagnosis not present

## 2019-01-30 DIAGNOSIS — E041 Nontoxic single thyroid nodule: Secondary | ICD-10-CM | POA: Diagnosis not present

## 2019-01-30 DIAGNOSIS — Z23 Encounter for immunization: Secondary | ICD-10-CM | POA: Diagnosis not present

## 2019-01-30 DIAGNOSIS — Z0000 Encounter for general adult medical examination without abnormal findings: Secondary | ICD-10-CM | POA: Diagnosis not present

## 2019-01-30 DIAGNOSIS — E559 Vitamin D deficiency, unspecified: Secondary | ICD-10-CM | POA: Diagnosis not present

## 2019-01-30 DIAGNOSIS — Z131 Encounter for screening for diabetes mellitus: Secondary | ICD-10-CM | POA: Diagnosis not present

## 2019-01-30 DIAGNOSIS — E785 Hyperlipidemia, unspecified: Secondary | ICD-10-CM | POA: Diagnosis not present

## 2019-01-30 DIAGNOSIS — R591 Generalized enlarged lymph nodes: Secondary | ICD-10-CM | POA: Diagnosis not present

## 2019-02-14 ENCOUNTER — Telehealth: Payer: Self-pay | Admitting: Genetic Counselor

## 2019-02-14 ENCOUNTER — Encounter: Payer: Self-pay | Admitting: Genetic Counselor

## 2019-02-14 NOTE — Telephone Encounter (Signed)
A genetic counseling appt has been scheduled for the pt to see Alexa Gonzalez on 6/3 at 10am. Letter mailed.

## 2019-04-09 DIAGNOSIS — Z01419 Encounter for gynecological examination (general) (routine) without abnormal findings: Secondary | ICD-10-CM | POA: Diagnosis not present

## 2019-04-09 DIAGNOSIS — Z124 Encounter for screening for malignant neoplasm of cervix: Secondary | ICD-10-CM | POA: Diagnosis not present

## 2019-04-09 DIAGNOSIS — Z1231 Encounter for screening mammogram for malignant neoplasm of breast: Secondary | ICD-10-CM | POA: Diagnosis not present

## 2019-04-09 DIAGNOSIS — N920 Excessive and frequent menstruation with regular cycle: Secondary | ICD-10-CM | POA: Diagnosis not present

## 2019-04-09 DIAGNOSIS — Z1151 Encounter for screening for human papillomavirus (HPV): Secondary | ICD-10-CM | POA: Diagnosis not present

## 2019-04-09 DIAGNOSIS — Z6822 Body mass index (BMI) 22.0-22.9, adult: Secondary | ICD-10-CM | POA: Diagnosis not present

## 2019-04-23 ENCOUNTER — Telehealth: Payer: Self-pay | Admitting: Genetic Counselor

## 2019-04-23 NOTE — Telephone Encounter (Signed)
Called patient regarding upcoming [facility] appointment, per patient's request appointment has been cancelled. Patient will call back when ready to reschedule.  Message to Santiago Glad.

## 2019-04-24 ENCOUNTER — Encounter: Payer: Self-pay | Admitting: Genetic Counselor

## 2019-04-25 ENCOUNTER — Inpatient Hospital Stay: Payer: BLUE CROSS/BLUE SHIELD

## 2019-04-25 ENCOUNTER — Inpatient Hospital Stay: Payer: BLUE CROSS/BLUE SHIELD | Admitting: Genetic Counselor

## 2019-08-01 DIAGNOSIS — Z85828 Personal history of other malignant neoplasm of skin: Secondary | ICD-10-CM | POA: Diagnosis not present

## 2019-08-01 DIAGNOSIS — L570 Actinic keratosis: Secondary | ICD-10-CM | POA: Diagnosis not present

## 2019-08-01 DIAGNOSIS — D225 Melanocytic nevi of trunk: Secondary | ICD-10-CM | POA: Diagnosis not present

## 2019-08-01 DIAGNOSIS — C44719 Basal cell carcinoma of skin of left lower limb, including hip: Secondary | ICD-10-CM | POA: Diagnosis not present

## 2019-08-01 DIAGNOSIS — L821 Other seborrheic keratosis: Secondary | ICD-10-CM | POA: Diagnosis not present

## 2020-01-29 DIAGNOSIS — D225 Melanocytic nevi of trunk: Secondary | ICD-10-CM | POA: Diagnosis not present

## 2020-01-29 DIAGNOSIS — L821 Other seborrheic keratosis: Secondary | ICD-10-CM | POA: Diagnosis not present

## 2020-01-29 DIAGNOSIS — L814 Other melanin hyperpigmentation: Secondary | ICD-10-CM | POA: Diagnosis not present

## 2020-01-29 DIAGNOSIS — C44712 Basal cell carcinoma of skin of right lower limb, including hip: Secondary | ICD-10-CM | POA: Diagnosis not present

## 2020-01-29 DIAGNOSIS — D224 Melanocytic nevi of scalp and neck: Secondary | ICD-10-CM | POA: Diagnosis not present

## 2020-02-06 DIAGNOSIS — Z5181 Encounter for therapeutic drug level monitoring: Secondary | ICD-10-CM | POA: Diagnosis not present

## 2020-02-06 DIAGNOSIS — E559 Vitamin D deficiency, unspecified: Secondary | ICD-10-CM | POA: Diagnosis not present

## 2020-02-06 DIAGNOSIS — Z0000 Encounter for general adult medical examination without abnormal findings: Secondary | ICD-10-CM | POA: Diagnosis not present

## 2020-02-06 DIAGNOSIS — E041 Nontoxic single thyroid nodule: Secondary | ICD-10-CM | POA: Diagnosis not present

## 2020-02-06 DIAGNOSIS — E785 Hyperlipidemia, unspecified: Secondary | ICD-10-CM | POA: Diagnosis not present

## 2020-07-09 DIAGNOSIS — Z1231 Encounter for screening mammogram for malignant neoplasm of breast: Secondary | ICD-10-CM | POA: Diagnosis not present

## 2020-07-09 DIAGNOSIS — Z6821 Body mass index (BMI) 21.0-21.9, adult: Secondary | ICD-10-CM | POA: Diagnosis not present

## 2020-07-09 DIAGNOSIS — Z01419 Encounter for gynecological examination (general) (routine) without abnormal findings: Secondary | ICD-10-CM | POA: Diagnosis not present

## 2020-09-01 DIAGNOSIS — C44519 Basal cell carcinoma of skin of other part of trunk: Secondary | ICD-10-CM | POA: Diagnosis not present

## 2020-09-01 DIAGNOSIS — Z85828 Personal history of other malignant neoplasm of skin: Secondary | ICD-10-CM | POA: Diagnosis not present

## 2020-09-01 DIAGNOSIS — L821 Other seborrheic keratosis: Secondary | ICD-10-CM | POA: Diagnosis not present

## 2020-09-01 DIAGNOSIS — L814 Other melanin hyperpigmentation: Secondary | ICD-10-CM | POA: Diagnosis not present

## 2020-09-01 DIAGNOSIS — D225 Melanocytic nevi of trunk: Secondary | ICD-10-CM | POA: Diagnosis not present

## 2020-09-03 DIAGNOSIS — F3281 Premenstrual dysphoric disorder: Secondary | ICD-10-CM | POA: Diagnosis not present

## 2021-04-15 DIAGNOSIS — L814 Other melanin hyperpigmentation: Secondary | ICD-10-CM | POA: Diagnosis not present

## 2021-04-15 DIAGNOSIS — Z85828 Personal history of other malignant neoplasm of skin: Secondary | ICD-10-CM | POA: Diagnosis not present

## 2021-04-15 DIAGNOSIS — C44619 Basal cell carcinoma of skin of left upper limb, including shoulder: Secondary | ICD-10-CM | POA: Diagnosis not present

## 2021-10-06 DIAGNOSIS — Z85828 Personal history of other malignant neoplasm of skin: Secondary | ICD-10-CM | POA: Diagnosis not present

## 2021-10-06 DIAGNOSIS — L814 Other melanin hyperpigmentation: Secondary | ICD-10-CM | POA: Diagnosis not present

## 2021-10-06 DIAGNOSIS — L821 Other seborrheic keratosis: Secondary | ICD-10-CM | POA: Diagnosis not present

## 2021-10-06 DIAGNOSIS — D225 Melanocytic nevi of trunk: Secondary | ICD-10-CM | POA: Diagnosis not present

## 2022-01-09 DIAGNOSIS — R1030 Lower abdominal pain, unspecified: Secondary | ICD-10-CM | POA: Diagnosis not present

## 2022-01-09 DIAGNOSIS — R42 Dizziness and giddiness: Secondary | ICD-10-CM | POA: Diagnosis not present

## 2022-01-09 DIAGNOSIS — R1084 Generalized abdominal pain: Secondary | ICD-10-CM | POA: Diagnosis not present

## 2022-02-23 DIAGNOSIS — Z6821 Body mass index (BMI) 21.0-21.9, adult: Secondary | ICD-10-CM | POA: Diagnosis not present

## 2022-02-23 DIAGNOSIS — Z01411 Encounter for gynecological examination (general) (routine) with abnormal findings: Secondary | ICD-10-CM | POA: Diagnosis not present

## 2022-02-23 DIAGNOSIS — Z1231 Encounter for screening mammogram for malignant neoplasm of breast: Secondary | ICD-10-CM | POA: Diagnosis not present

## 2022-02-23 DIAGNOSIS — Z124 Encounter for screening for malignant neoplasm of cervix: Secondary | ICD-10-CM | POA: Diagnosis not present

## 2022-02-23 DIAGNOSIS — Z113 Encounter for screening for infections with a predominantly sexual mode of transmission: Secondary | ICD-10-CM | POA: Diagnosis not present

## 2022-02-23 DIAGNOSIS — Z0142 Encounter for cervical smear to confirm findings of recent normal smear following initial abnormal smear: Secondary | ICD-10-CM | POA: Diagnosis not present

## 2022-02-23 DIAGNOSIS — Z01419 Encounter for gynecological examination (general) (routine) without abnormal findings: Secondary | ICD-10-CM | POA: Diagnosis not present

## 2022-03-09 ENCOUNTER — Encounter: Payer: Self-pay | Admitting: Gastroenterology

## 2022-04-02 ENCOUNTER — Ambulatory Visit (AMBULATORY_SURGERY_CENTER): Payer: Self-pay

## 2022-04-02 ENCOUNTER — Other Ambulatory Visit: Payer: Self-pay

## 2022-04-02 VITALS — Ht 66.0 in | Wt 127.0 lb

## 2022-04-02 DIAGNOSIS — Z1211 Encounter for screening for malignant neoplasm of colon: Secondary | ICD-10-CM

## 2022-04-02 MED ORDER — NA SULFATE-K SULFATE-MG SULF 17.5-3.13-1.6 GM/177ML PO SOLN: 1.0000 | Freq: Once | ORAL | 0 refills | Status: AC

## 2022-04-02 NOTE — Progress Notes (Signed)
Denies allergies to eggs or soy products. Denies complication of anesthesia or sedation. Denies use of weight loss medication. Denies use of O2.   Emmi instructions given for colonoscopy.  

## 2022-04-07 DIAGNOSIS — L570 Actinic keratosis: Secondary | ICD-10-CM | POA: Diagnosis not present

## 2022-04-07 DIAGNOSIS — D225 Melanocytic nevi of trunk: Secondary | ICD-10-CM | POA: Diagnosis not present

## 2022-04-07 DIAGNOSIS — L812 Freckles: Secondary | ICD-10-CM | POA: Diagnosis not present

## 2022-04-07 DIAGNOSIS — L821 Other seborrheic keratosis: Secondary | ICD-10-CM | POA: Diagnosis not present

## 2022-04-07 DIAGNOSIS — Z85828 Personal history of other malignant neoplasm of skin: Secondary | ICD-10-CM | POA: Diagnosis not present

## 2022-04-27 ENCOUNTER — Encounter: Payer: Self-pay | Admitting: Gastroenterology

## 2022-04-30 ENCOUNTER — Telehealth: Payer: Self-pay | Admitting: Gastroenterology

## 2022-04-30 ENCOUNTER — Encounter: Payer: Self-pay | Admitting: Gastroenterology

## 2022-04-30 ENCOUNTER — Ambulatory Visit (AMBULATORY_SURGERY_CENTER): Payer: BC Managed Care – PPO | Admitting: Gastroenterology

## 2022-04-30 VITALS — BP 105/67 | HR 63 | Temp 97.8°F | Resp 11 | Ht 66.5 in | Wt 127.0 lb

## 2022-04-30 DIAGNOSIS — Z1211 Encounter for screening for malignant neoplasm of colon: Secondary | ICD-10-CM

## 2022-04-30 MED ORDER — SODIUM CHLORIDE 0.9 % IV SOLN: 500.0000 mL | Freq: Once | INTRAVENOUS | Status: DC

## 2022-04-30 NOTE — Progress Notes (Signed)
   Referring Provider: Brien Few, MD Primary Care Physician:  Pcp, No  Indication for Procedure:  Colon cancer screening   IMPRESSION:  Need for colon cancer screening Appropriate candidate for monitored anesthesia care  PLAN: Colonoscopy in the [place] today   HPI: Alexa Gonzalez is a 51 y.o. female presents for screening colonoscopy.  No prior colonoscopy or colon cancer screening.  No baseline GI symptoms.   Sister with colon polyps. No other known family history of colon cancer or polyps. No family history of uterine/endometrial cancer, pancreatic cancer or gastric/stomach cancer.   Past Medical History:  Diagnosis Date   Allergy    BCC (basal cell carcinoma of skin) 11/23/2011   History of chicken pox childhood   History of recurrent UTI (urinary tract infection)    Mitral valve prolapse     Past Surgical History:  Procedure Laterality Date   BREAST ENHANCEMENT SURGERY  2010   BREAST SURGERY  1995   Breast Biopsy   REFRACTIVE SURGERY  03/2014   stone-cypher    Current Outpatient Medications  Medication Sig Dispense Refill   OVER THE COUNTER MEDICATION Calcium and Magnesium capsule. One daily.     OVER THE COUNTER MEDICATION Vitamin D 3 one capsule daily.     Current Facility-Administered Medications  Medication Dose Route Frequency Provider Last Rate Last Admin   0.9 %  sodium chloride infusion  500 mL Intravenous Once Daryel November, MD        Allergies as of 04/30/2022 - Review Complete 04/30/2022  Allergen Reaction Noted   Ciprofloxacin     Hydrocodone      Family History  Problem Relation Age of Onset   Hyperlipidemia Father    Colon polyps Sister    Breast cancer Maternal Aunt 68       recurrent 15y later, opposite side   Heart failure Paternal Grandmother    Hyperlipidemia Paternal Grandfather    Heart attack Paternal Grandfather    Colon cancer Neg Hx    Esophageal cancer Neg Hx    Rectal cancer Neg Hx    Stomach cancer Neg  Hx      Physical Exam: General:   Alert,  well-nourished, pleasant and cooperative in NAD Head:  Normocephalic and atraumatic. Eyes:  Sclera clear, no icterus.   Conjunctiva pink. Mouth:  No deformity or lesions.   Neck:  Supple; no masses or thyromegaly. Lungs:  Clear throughout to auscultation.   No wheezes. Heart:  Regular rate and rhythm; no murmurs. Abdomen:  Soft, non-tender, nondistended, normal bowel sounds, no rebound or guarding.  Msk:  Symmetrical. No boney deformities LAD: No inguinal or umbilical LAD Extremities:  No clubbing or edema. Neurologic:  Alert and  oriented x4;  grossly nonfocal Skin:  No obvious rash or bruise. Psych:  Alert and cooperative. Normal mood and affect.     Studies/Results: No results found.    Lene Mckay L. Tarri Glenn, MD, MPH 04/30/2022, 11:28 AM

## 2022-04-30 NOTE — Op Note (Signed)
[place] Patient Name: Alexa Gonzalez Procedure Date: 04/30/2022 11:32 AM MRN: [id number] Endoscopist: Thornton Park MD, MD Age: 51 Referring MD:  Date of Birth: 1971/06/21 Gender: Female Account #: [id number] Procedure:                Colonoscopy Indications:              Screening for colorectal malignant neoplasm, This                            is the patient's first colonoscopy                           Sister with precancerous polyps                           No other known family history of colon cancer or                            polyps Medicines:                Monitored Anesthesia Care Procedure:                Pre-Anesthesia Assessment:                           - Prior to the procedure, a History and Physical                            was performed, and patient medications and                            allergies were reviewed. The patient's tolerance of                            previous anesthesia was also reviewed. The risks                            and benefits of the procedure and the sedation                            options and risks were discussed with the patient.                            All questions were answered, and informed consent                            was obtained. Prior Anticoagulants: The patient has                            taken no previous anticoagulant or antiplatelet                            agents. ASA Grade Assessment: II - A patient with  mild systemic disease. After reviewing the risks                            and benefits, the patient was deemed in                            satisfactory condition to undergo the procedure.                           After obtaining informed consent, the colonoscope                            was passed under direct vision. Throughout the                            procedure, the patient's blood pressure, pulse, and                            oxygen  saturations were monitored continuously. The                            CF [id number] #[id number] was introduced through the anus                            and advanced to the 3 cm into the ileum. A second                            forward view of the right colon was performed. The                            colonoscopy was technically difficult and complex                            due to significant looping in the rectosigmoid                            junction. Successful completion of the procedure                            was aided by changing the patient's position,                            withdrawing and reinserting the scope, withdrawing                            the scope and replacing with the pediatric                            colonoscope and applying abdominal pressure. She                            developed bradycardia with looping that was treated  with Robinul. Please see the anesthesia notes for                            complete details. Switch to the pediatric                            colonoscopy resulted in less looping. The quality                            of the bowel preparation was excellent. The                            terminal ileum, ileocecal valve, appendiceal                            orifice, and rectum were photographed. Scope In: 11:36:14 AM Scope Out: 13:24:40 AM Scope Withdrawal Time: 0 hours 10 minutes 9 seconds  Total Procedure Duration: 0 hours 20 minutes 2 seconds  Findings:                 The perianal and digital rectal examinations were                            normal.                           Excessive looping occurred in the distal sigmoid.                           The entire examined colon appeared normal on direct                            and retroflexion views. No polyps or mass seen.                           The terminal ileum appeared normal. Complications:            Bradycardia that occurred  with loop formation,                            treated with Robinul Estimated Blood Loss:     Estimated blood loss: none. Impression:               - There was significant looping of the colon with                            associated bradycardia.                           - The entire examined colon is normal on direct and                            retroflexion views.                           - The examined portion of the ileum was normal.                           -  No specimens collected. Recommendation:           - Patient has a contact number available for                            emergencies. The signs and symptoms of potential                            delayed complications were discussed with the                            patient. Return to normal activities tomorrow.                            Written discharge instructions were provided to the                            patient.                           - Resume previous diet.                           - Continue present medications.                           - Repeat colonoscopy in 5 years for surveillance                            given the family history. Use pediatric colonscope                            at that time.                           - Emerging evidence supports eating a diet of                            fruits, vegetables, grains, calcium, and yogurt                            while reducing red meat and alcohol may reduce the                            risk of colon cancer.                           - Thank you for allowing me to be involved in your                            colon cancer prevention. Thornton Park MD, MD 04/30/2022 12:11:32 PM This report has been signed electronically.

## 2022-04-30 NOTE — Progress Notes (Signed)
Vitals-cw  Pt's states no medical or surgical changes since previsit or office visit.

## 2022-04-30 NOTE — Patient Instructions (Addendum)
Read all of the handouts given to you by your recovery room nurse.  YOU HAD AN ENDOSCOPIC PROCEDURE TODAY AT [facility]:   Refer to the procedure report that was given to you for any specific questions about what was found during the examination.  If the procedure report does not answer your questions, please call your gastroenterologist to clarify.  If you requested that your care partner not be given the details of your procedure findings, then the procedure report has been included in a sealed envelope for you to review at your convenience later.  YOU SHOULD EXPECT: Some feelings of bloating in the abdomen. Passage of more gas than usual.  Walking can help get rid of the air that was put into your GI tract during the procedure and reduce the bloating. If you had a lower endoscopy (such as a colonoscopy or flexible sigmoidoscopy) you may notice spotting of blood in your stool or on the toilet paper. If you underwent a bowel prep for your procedure, you may not have a normal bowel movement for a few days.  Please Note:  You might notice some irritation and congestion in your nose or some drainage.  This is from the oxygen used during your procedure.  There is no need for concern and it should clear up in a day or so.  SYMPTOMS TO REPORT IMMEDIATELY:  Following lower endoscopy (colonoscopy or flexible sigmoidoscopy):  Excessive amounts of blood in the stool  Significant tenderness or worsening of abdominal pains  Swelling of the abdomen that is new, acute  Fever of 100F or higher   For urgent or emergent issues, a gastroenterologist can be reached at any hour by calling [phone]. Do not use MyChart messaging for urgent concerns.    DIET:  We do recommend a small meal at first, but then you may proceed to your regular diet.  Drink plenty of fluids but you should avoid alcoholic beverages for 24 hours.  ACTIVITY:  You should plan to take it easy for the rest of today and  you should NOT DRIVE or use heavy machinery until tomorrow (because of the sedation medicines used during the test).    FOLLOW UP: Our staff will call the number listed on your records 24-72 hours following your procedure to check on you and address any questions or concerns that you may have regarding the information given to you following your procedure. If we do not reach you, we will leave a message.  We will attempt to reach you two times.  During this call, we will ask if you have developed any symptoms of COVID 19. If you develop any symptoms (ie: fever, flu-like symptoms, shortness of breath, cough etc.) before then, please call [phone].  If you test positive for Covid 19 in the 2 weeks post procedure, please call and report this information to [place].     SIGNATURES/CONFIDENTIALITY: You and/or your care partner have signed paperwork which will be entered into your electronic medical record.  These signatures attest to the fact that that the information above on your After Visit Summary has been reviewed and is understood.  Full responsibility of the confidentiality of this discharge information lies with you and/or your care-partner.

## 2022-04-30 NOTE — Progress Notes (Signed)
Report to PACU, RN, vss, BBS= Clear.  

## 2022-04-30 NOTE — Progress Notes (Signed)
Approx 1138 pt brady to asystole (full line) to brady.   Abd pressure was being given.  Abd pressure stopped.  Sedation halted.  Scope pulled back and 0.2 Robinul given.  Waited till HR started to come back up.  Abd pressure reapplied sedation started again and scope started to advance. Same thing happened.  Loletha Grayer to asystole (full line) to brady.  Abd pressure stopped.  Sedation halted.  Scope pulled back.  This time pt started vomitting clear frothy fluid.  HOB dropped to steep t-burg and suctioning started.  Scope puled all the way out and allowed pt to wake up enough to follow commands to cough/swallow and report any issues (none)  Second dose of 0.2 robinul and 4 mg Zofran given and Smaller scope obtained.  Restarted sedation and procedure at 1143.  No further incident

## 2022-04-30 NOTE — Telephone Encounter (Signed)
Alexa Gonzalez called the call center this evening because of persistent symptoms of bloating, belching and abdominal discomfort since her colonoscopy earlier today.  I reviewed the procedure report by Dr. Tarri Glenn which indicated that she had a rather difficult colonoscopy because of tortuous sigmoid colon, and experienced bradycardia requiring Robinul.  No polyps were removed and no biopsies taken. The patient denied symptoms of severe abdominal pain, nausea fever/chills.  She was just feeling very bloated and uncomfortable and was wondering if this is expected.  She has been able to tolerate small amounts of food, but has little appetite. I provided her some reassurance that her discomfort is most likely from air trapping during her colonoscopy, and that this should continue to improve with time.  I encouraged her to try to remain active and continue to move around this might help the air move along.  I suggested she follow-up a bland diet for today without much fat or fiber. She has been taking Gas-X and wonders how much is safe.  I advised her that the label indicates that 500 mg/day is the max recommended.  I suggested she could also try IBgard which may help relieve some of her discomfort. The patient indicated understanding and appreciation for the advice and reassurance.

## 2022-05-03 ENCOUNTER — Telehealth: Payer: Self-pay | Admitting: *Deleted

## 2022-05-03 NOTE — Telephone Encounter (Signed)
  Follow up Call-     04/30/2022   10:57 AM 04/30/2022   10:54 AM  Call back number  Post procedure Call Back phone  # [phone]   Permission to leave phone message  Yes     Patient questions:  Do you have a fever, pain , or abdominal swelling? No. Pain Score  0 *  Have you tolerated food without any problems? Yes.    Have you been able to return to your normal activities? Yes.    Do you have any questions about your discharge instructions: Diet   No. Medications  No. Follow up visit  No.  Do you have questions or concerns about your Care? No.  Actions: * If pain score is 4 or above: No action needed, pain <4.

## 2022-05-03 NOTE — Telephone Encounter (Signed)
First attempt for follow up phone call. No answer at number given.  Left message on voicemail.

## 2022-11-30 DIAGNOSIS — N951 Menopausal and female climacteric states: Secondary | ICD-10-CM | POA: Diagnosis not present

## 2022-12-07 DIAGNOSIS — E041 Nontoxic single thyroid nodule: Secondary | ICD-10-CM | POA: Diagnosis not present

## 2022-12-07 DIAGNOSIS — E785 Hyperlipidemia, unspecified: Secondary | ICD-10-CM | POA: Diagnosis not present

## 2022-12-09 DIAGNOSIS — M6208 Separation of muscle (nontraumatic), other site: Secondary | ICD-10-CM | POA: Diagnosis not present

## 2022-12-15 DIAGNOSIS — M6208 Separation of muscle (nontraumatic), other site: Secondary | ICD-10-CM | POA: Diagnosis not present

## 2022-12-17 DIAGNOSIS — M6208 Separation of muscle (nontraumatic), other site: Secondary | ICD-10-CM | POA: Diagnosis not present

## 2022-12-20 DIAGNOSIS — M6208 Separation of muscle (nontraumatic), other site: Secondary | ICD-10-CM | POA: Diagnosis not present

## 2022-12-29 DIAGNOSIS — M6208 Separation of muscle (nontraumatic), other site: Secondary | ICD-10-CM | POA: Diagnosis not present

## 2022-12-31 DIAGNOSIS — M6208 Separation of muscle (nontraumatic), other site: Secondary | ICD-10-CM | POA: Diagnosis not present

## 2023-01-12 DIAGNOSIS — M6208 Separation of muscle (nontraumatic), other site: Secondary | ICD-10-CM | POA: Diagnosis not present

## 2023-01-17 DIAGNOSIS — M6208 Separation of muscle (nontraumatic), other site: Secondary | ICD-10-CM | POA: Diagnosis not present

## 2023-01-26 DIAGNOSIS — M6208 Separation of muscle (nontraumatic), other site: Secondary | ICD-10-CM | POA: Diagnosis not present

## 2023-04-12 DIAGNOSIS — Z85828 Personal history of other malignant neoplasm of skin: Secondary | ICD-10-CM | POA: Diagnosis not present

## 2023-04-12 DIAGNOSIS — L821 Other seborrheic keratosis: Secondary | ICD-10-CM | POA: Diagnosis not present

## 2023-04-12 DIAGNOSIS — D224 Melanocytic nevi of scalp and neck: Secondary | ICD-10-CM | POA: Diagnosis not present

## 2023-04-12 DIAGNOSIS — D225 Melanocytic nevi of trunk: Secondary | ICD-10-CM | POA: Diagnosis not present

## 2023-04-28 DIAGNOSIS — Z01419 Encounter for gynecological examination (general) (routine) without abnormal findings: Secondary | ICD-10-CM | POA: Diagnosis not present

## 2023-04-28 DIAGNOSIS — Z124 Encounter for screening for malignant neoplasm of cervix: Secondary | ICD-10-CM | POA: Diagnosis not present

## 2023-04-28 DIAGNOSIS — Z1231 Encounter for screening mammogram for malignant neoplasm of breast: Secondary | ICD-10-CM | POA: Diagnosis not present

## 2023-11-30 DIAGNOSIS — E785 Hyperlipidemia, unspecified: Secondary | ICD-10-CM | POA: Diagnosis not present

## 2023-11-30 DIAGNOSIS — E039 Hypothyroidism, unspecified: Secondary | ICD-10-CM | POA: Diagnosis not present

## 2023-12-07 DIAGNOSIS — Z0000 Encounter for general adult medical examination without abnormal findings: Secondary | ICD-10-CM | POA: Diagnosis not present

## 2023-12-07 DIAGNOSIS — Z1339 Encounter for screening examination for other mental health and behavioral disorders: Secondary | ICD-10-CM | POA: Diagnosis not present

## 2023-12-07 DIAGNOSIS — Z1331 Encounter for screening for depression: Secondary | ICD-10-CM | POA: Diagnosis not present

## 2023-12-29 DIAGNOSIS — L821 Other seborrheic keratosis: Secondary | ICD-10-CM | POA: Diagnosis not present

## 2023-12-29 DIAGNOSIS — D225 Melanocytic nevi of trunk: Secondary | ICD-10-CM | POA: Diagnosis not present

## 2023-12-29 DIAGNOSIS — Z85828 Personal history of other malignant neoplasm of skin: Secondary | ICD-10-CM | POA: Diagnosis not present

## 2023-12-29 DIAGNOSIS — L570 Actinic keratosis: Secondary | ICD-10-CM | POA: Diagnosis not present

## 2024-05-17 DIAGNOSIS — G4700 Insomnia, unspecified: Secondary | ICD-10-CM | POA: Diagnosis not present

## 2024-05-17 DIAGNOSIS — N943 Premenstrual tension syndrome: Secondary | ICD-10-CM | POA: Diagnosis not present

## 2024-05-17 DIAGNOSIS — Z01411 Encounter for gynecological examination (general) (routine) with abnormal findings: Secondary | ICD-10-CM | POA: Diagnosis not present

## 2024-05-17 DIAGNOSIS — Z1231 Encounter for screening mammogram for malignant neoplasm of breast: Secondary | ICD-10-CM | POA: Diagnosis not present

## 2024-05-17 DIAGNOSIS — Z1331 Encounter for screening for depression: Secondary | ICD-10-CM | POA: Diagnosis not present
